# Patient Record
Sex: Male | Born: 2000 | Race: Black or African American | Hispanic: No | Marital: Single | Smoking: Never smoker
Health system: Southern US, Community
[De-identification: ages and names within clinical notes are randomized; demographics above are authoritative.]

## PROBLEM LIST (undated history)

## (undated) DIAGNOSIS — F4310 Post-traumatic stress disorder, unspecified: Secondary | ICD-10-CM

## (undated) DIAGNOSIS — F329 Major depressive disorder, single episode, unspecified: Secondary | ICD-10-CM

## (undated) DIAGNOSIS — F32A Depression, unspecified: Secondary | ICD-10-CM

## (undated) DIAGNOSIS — T7840XA Allergy, unspecified, initial encounter: Secondary | ICD-10-CM

## (undated) DIAGNOSIS — J45909 Unspecified asthma, uncomplicated: Secondary | ICD-10-CM

---
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
## 2019-06-25 ENCOUNTER — Encounter (HOSPITAL_COMMUNITY): Payer: Self-pay | Admitting: *Deleted

## 2019-06-25 ENCOUNTER — Emergency Department (HOSPITAL_COMMUNITY)
Admission: EM | Admit: 2019-06-25 | Discharge: 2019-06-25 | Disposition: A | Discharge status: Home / Self Care | Payer: Medicaid Other | Attending: Emergency Medicine | Admitting: Emergency Medicine

## 2019-06-25 ENCOUNTER — Other Ambulatory Visit: Payer: Self-pay

## 2019-06-25 ENCOUNTER — Emergency Department (HOSPITAL_COMMUNITY): Payer: Medicaid Other

## 2019-06-25 DIAGNOSIS — Z79899 Other long term (current) drug therapy: Secondary | ICD-10-CM | POA: Diagnosis not present

## 2019-06-25 DIAGNOSIS — Y92410 Unspecified street and highway as the place of occurrence of the external cause: Secondary | ICD-10-CM | POA: Insufficient documentation

## 2019-06-25 DIAGNOSIS — S50311A Abrasion of right elbow, initial encounter: Secondary | ICD-10-CM | POA: Diagnosis not present

## 2019-06-25 DIAGNOSIS — Y9351 Activity, roller skating (inline) and skateboarding: Secondary | ICD-10-CM | POA: Diagnosis not present

## 2019-06-25 DIAGNOSIS — Y999 Unspecified external cause status: Secondary | ICD-10-CM | POA: Diagnosis not present

## 2019-06-25 DIAGNOSIS — S81012A Laceration without foreign body, left knee, initial encounter: Secondary | ICD-10-CM | POA: Diagnosis not present

## 2019-06-25 DIAGNOSIS — Z23 Encounter for immunization: Secondary | ICD-10-CM | POA: Insufficient documentation

## 2019-06-25 DIAGNOSIS — J45909 Unspecified asthma, uncomplicated: Secondary | ICD-10-CM | POA: Diagnosis not present

## 2019-06-25 MED ORDER — LIDOCAINE-EPINEPHRINE (PF) 2 %-1:200000 IJ SOLN
10.0000 mL | Freq: Once | INTRAMUSCULAR | Status: AC
  Administered 2019-06-25: 10 mL
  Filled 2019-06-25: qty 20

## 2019-06-25 MED ORDER — TETANUS-DIPHTH-ACELL PERTUSSIS 5-2.5-18.5 LF-MCG/0.5 IM SUSP
0.5000 mL | Freq: Once | INTRAMUSCULAR | Status: AC
  Administered 2019-06-25: 0.5 mL via INTRAMUSCULAR
  Filled 2019-06-25: qty 0.5

## 2019-06-25 MED ORDER — DOXYCYCLINE HYCLATE 100 MG PO CAPS
100.0000 mg | ORAL_CAPSULE | Freq: Two times a day (BID) | ORAL | 0 refills | Status: AC
Start: 2019-06-25 — End: 2019-07-02

## 2019-06-25 MED ORDER — LIDOCAINE-EPINEPHRINE-TETRACAINE (LET) SOLUTION
3.0000 mL | Freq: Once | NASAL | Status: AC
  Administered 2019-06-25: 3 mL via TOPICAL
  Filled 2019-06-25: qty 3
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
## 2019-06-25 NOTE — ED Notes (Signed)
ED Provider at bedside. 

## 2019-06-25 NOTE — ED Triage Notes (Signed)
Pt reports injuring left knee while skating. Has large laceration noted, bleeding controlled.

## 2019-06-25 NOTE — Discharge Instructions (Signed)
You have been diagnosed today with  Left Knee Laceration and Right Elbow Abrasion.  At this time there does not appear to be the presence of an emergent medical condition, however there is always the potential for conditions to change. Please read and follow the below instructions.  Please return to the Emergency Department immediately for any new or worsening symptoms. Please be sure to follow up with your Primary Care Provider within one week regarding your visit today; please call their office to schedule an appointment even if you are feeling better for a follow-up visit. Your 8 stitches must be removed in 8 days, these can be removed at an urgent care, your primary care provider's office or if necessary here at the emergency department.  Please take the antibiotic doxycycline as prescribed to help prevent infection.  Get help right away if: You have very bad swelling around your wound. You have pus or a bad smell coming from your wound. Your pain suddenly gets worse and is very bad. You have painful lumps near your wound or anywhere on your body. You have a red streak going away from your wound. The wound is on your hand or foot, and: You cannot move a finger or toe as you used to do. Your fingers or toes look pale or blue. You have numbness that spreads down your hand, foot, fingers, or toes. You have a red streak going away from your wound. You have a fever. You have fluid, blood, or pus coming from your wound. There is a bad smell coming from your wound or bandage. Any new/concerning or worsening symptoms  Please read the additional information packets attached to your discharge summary.  Do not take your medicine if  develop an itchy rash, swelling in your mouth or lips, or difficulty breathing; call 911 and seek immediate emergency medical attention if this occurs.

## 2019-06-25 NOTE — ED Notes (Signed)
Patient verbalized understanding of dc instructions, vss, ambulatory with nad.   

## 2020-03-05 ENCOUNTER — Encounter (HOSPITAL_COMMUNITY): Payer: Self-pay

## 2020-03-05 ENCOUNTER — Other Ambulatory Visit: Payer: Self-pay

## 2020-03-05 ENCOUNTER — Emergency Department (HOSPITAL_COMMUNITY)
Admission: EM | Admit: 2020-03-05 | Discharge: 2020-03-06 | Disposition: A | Discharge status: Home / Self Care | Payer: Medicaid Other | Attending: Emergency Medicine | Admitting: Emergency Medicine

## 2020-03-05 DIAGNOSIS — Z5321 Procedure and treatment not carried out due to patient leaving prior to being seen by health care provider: Secondary | ICD-10-CM | POA: Diagnosis not present

## 2020-03-05 DIAGNOSIS — R0981 Nasal congestion: Secondary | ICD-10-CM | POA: Insufficient documentation

## 2020-03-05 DIAGNOSIS — H9203 Otalgia, bilateral: Secondary | ICD-10-CM | POA: Insufficient documentation

## 2020-03-05 DIAGNOSIS — J45909 Unspecified asthma, uncomplicated: Secondary | ICD-10-CM | POA: Diagnosis not present

## 2020-03-05 NOTE — ED Triage Notes (Signed)
Pt reports that he has been having trouble with his asthma and nasal congestion, and pain in bilateral ears since yesterday morning. Denies fevers.

## 2020-03-15 ENCOUNTER — Other Ambulatory Visit: Payer: Self-pay

## 2020-03-15 ENCOUNTER — Emergency Department (HOSPITAL_COMMUNITY): Payer: Medicaid Other

## 2020-03-15 ENCOUNTER — Emergency Department (HOSPITAL_COMMUNITY)
Admission: EM | Admit: 2020-03-15 | Discharge: 2020-03-15 | Disposition: A | Discharge status: Home / Self Care | Payer: Medicaid Other | Attending: Emergency Medicine | Admitting: Emergency Medicine

## 2020-03-15 DIAGNOSIS — R109 Unspecified abdominal pain: Secondary | ICD-10-CM | POA: Insufficient documentation

## 2020-03-15 DIAGNOSIS — Z5321 Procedure and treatment not carried out due to patient leaving prior to being seen by health care provider: Secondary | ICD-10-CM | POA: Insufficient documentation

## 2020-03-15 DIAGNOSIS — R112 Nausea with vomiting, unspecified: Secondary | ICD-10-CM | POA: Insufficient documentation

## 2020-03-15 LAB — COMPREHENSIVE METABOLIC PANEL
ALT: 22 U/L (ref 0–44)
AST: 22 U/L (ref 15–41)
Albumin: 4.7 g/dL (ref 3.5–5.0)
Alkaline Phosphatase: 51 U/L (ref 38–126)
Anion gap: 10 (ref 5–15)
BUN: 11 mg/dL (ref 6–20)
CO2: 26 mmol/L (ref 22–32)
Calcium: 9.8 mg/dL (ref 8.9–10.3)
Chloride: 100 mmol/L (ref 98–111)
Creatinine, Ser: 0.73 mg/dL (ref 0.61–1.24)
GFR calc Af Amer: >60 mL/min (ref >60)
GFR calc non Af Amer: >60 mL/min (ref >60)
Glucose, Bld: 126 mg/dL — ABNORMAL HIGH (ref 70–99)
Potassium: 3.8 mmol/L (ref 3.5–5.1)
Sodium: 136 mmol/L (ref 135–145)
Total Bilirubin: 0.8 mg/dL (ref 0.3–1.2)
Total Protein: 7.8 g/dL (ref 6.5–8.1)

## 2020-03-15 LAB — CBC
HCT: 43.6 % (ref 39.0–52.0)
Hemoglobin: 14.7 g/dL (ref 13.0–17.0)
MCH: 29.2 pg (ref 26.0–34.0)
MCHC: 33.7 g/dL (ref 30.0–36.0)
MCV: 86.7 fL (ref 80.0–100.0)
Platelets: 411 10*3/uL — ABNORMAL HIGH (ref 150–400)
RBC: 5.03 MIL/uL (ref 4.22–5.81)
RDW: 13.5 % (ref 11.5–15.5)
WBC: 10.5 10*3/uL (ref 4.0–10.5)
nRBC: 0 % (ref 0.0–0.2)

## 2020-03-15 LAB — TROPONIN I (HIGH SENSITIVITY): Troponin I (High Sensitivity): 4 ng/L (ref <18)

## 2020-03-15 LAB — LIPASE, BLOOD: Lipase: 26 U/L (ref 11–51)

## 2020-03-15 MED ORDER — ONDANSETRON 4 MG PO TBDP
4.0000 mg | ORAL_TABLET | Freq: Once | ORAL | Status: AC | PRN
  Administered 2020-03-15: 10:00:00 4 mg via ORAL
  Filled 2020-03-15: qty 1

## 2020-03-15 MED ORDER — SODIUM CHLORIDE 0.9% FLUSH: 3.0000 mL | Freq: Once | INTRAVENOUS | Status: DC

## 2020-03-15 NOTE — ED Notes (Signed)
Requested urine
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
## 2020-03-15 NOTE — ED Notes (Signed)
Patient requested staff call his mother so mother can take patient home. Patient stated "I want my mother to take me home because this is taking too long"  Patient's mother contacted. Mother stated she was en route to get patient.

## 2021-04-30 ENCOUNTER — Other Ambulatory Visit: Payer: Self-pay

## 2021-04-30 ENCOUNTER — Emergency Department (HOSPITAL_COMMUNITY)
Admission: EM | Admit: 2021-04-30 | Discharge: 2021-05-01 | Disposition: A | Discharge status: Home / Self Care | Payer: Medicaid Other | Attending: Emergency Medicine | Admitting: Emergency Medicine

## 2021-04-30 DIAGNOSIS — S61411A Laceration without foreign body of right hand, initial encounter: Secondary | ICD-10-CM | POA: Diagnosis not present

## 2021-04-30 DIAGNOSIS — Z7722 Contact with and (suspected) exposure to environmental tobacco smoke (acute) (chronic): Secondary | ICD-10-CM | POA: Insufficient documentation

## 2021-04-30 DIAGNOSIS — S6991XA Unspecified injury of right wrist, hand and finger(s), initial encounter: Secondary | ICD-10-CM | POA: Diagnosis present

## 2021-04-30 DIAGNOSIS — J45909 Unspecified asthma, uncomplicated: Secondary | ICD-10-CM | POA: Diagnosis not present

## 2021-04-30 DIAGNOSIS — W228XXA Striking against or struck by other objects, initial encounter: Secondary | ICD-10-CM | POA: Diagnosis not present

## 2021-05-01 ENCOUNTER — Encounter (HOSPITAL_COMMUNITY): Payer: Self-pay

## 2021-05-01 ENCOUNTER — Other Ambulatory Visit: Payer: Self-pay

## 2021-05-01 ENCOUNTER — Emergency Department (HOSPITAL_COMMUNITY): Payer: Medicaid Other
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
## 2021-05-01 NOTE — ED Notes (Signed)
Dermabond at bedside.  

## 2021-05-01 NOTE — ED Notes (Signed)
Patients right hand wrapped with gauze and cleaned before discharge.

## 2021-05-01 NOTE — Discharge Instructions (Addendum)
Thank you for allowing me to care for you today in the Emergency Department.   Keep the wound clean and dry for the next 24 hours.  Then, you can gently clean the area with warm water and soap.  The Dermabond glue will fall off on its own over the next week.  You can take Tylenol Motrin for pain control.  Your tetanus was updated in 2020 and is good for 10 years.  Return to the emergency department if you develop severe swelling, redness to the hand, wrist, fingers, if you start to have thick, mucus-like drainage from around the wound, or other new, concerning symptoms.
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
## 2022-02-27 ENCOUNTER — Emergency Department (HOSPITAL_COMMUNITY): Payer: Medicaid Other

## 2022-02-27 ENCOUNTER — Encounter (HOSPITAL_COMMUNITY): Payer: Self-pay

## 2022-02-27 ENCOUNTER — Other Ambulatory Visit: Payer: Self-pay

## 2022-02-27 ENCOUNTER — Emergency Department (HOSPITAL_COMMUNITY)
Admission: EM | Admit: 2022-02-27 | Discharge: 2022-02-27 | Disposition: A | Discharge status: Home / Self Care | Payer: Medicaid Other | Attending: Emergency Medicine | Admitting: Emergency Medicine

## 2022-02-27 DIAGNOSIS — Z20822 Contact with and (suspected) exposure to covid-19: Secondary | ICD-10-CM | POA: Insufficient documentation

## 2022-02-27 DIAGNOSIS — F10129 Alcohol abuse with intoxication, unspecified: Secondary | ICD-10-CM | POA: Insufficient documentation

## 2022-02-27 DIAGNOSIS — F10929 Alcohol use, unspecified with intoxication, unspecified: Secondary | ICD-10-CM

## 2022-02-27 DIAGNOSIS — R112 Nausea with vomiting, unspecified: Secondary | ICD-10-CM

## 2022-02-27 DIAGNOSIS — R519 Headache, unspecified: Secondary | ICD-10-CM | POA: Insufficient documentation

## 2022-02-27 DIAGNOSIS — R079 Chest pain, unspecified: Secondary | ICD-10-CM

## 2022-02-27 DIAGNOSIS — R252 Cramp and spasm: Secondary | ICD-10-CM | POA: Diagnosis not present

## 2022-02-27 DIAGNOSIS — R001 Bradycardia, unspecified: Secondary | ICD-10-CM | POA: Insufficient documentation

## 2022-02-27 DIAGNOSIS — Y909 Presence of alcohol in blood, level not specified: Secondary | ICD-10-CM | POA: Insufficient documentation

## 2022-02-27 LAB — CBC WITH DIFFERENTIAL/PLATELET
Abs Immature Granulocytes: 0.02 10*3/uL (ref 0.00–0.07)
Basophils Absolute: 0.1 10*3/uL (ref 0.0–0.1)
Basophils Relative: 1 %
Eosinophils Absolute: 0.2 10*3/uL (ref 0.0–0.5)
Eosinophils Relative: 3 %
HCT: 44.5 % (ref 39.0–52.0)
Hemoglobin: 15 g/dL (ref 13.0–17.0)
Immature Granulocytes: 0 %
Lymphocytes Relative: 27 %
Lymphs Abs: 1.7 10*3/uL (ref 0.7–4.0)
MCH: 28.7 pg (ref 26.0–34.0)
MCHC: 33.7 g/dL (ref 30.0–36.0)
MCV: 85.1 fL (ref 80.0–100.0)
Monocytes Absolute: 0.2 10*3/uL (ref 0.1–1.0)
Monocytes Relative: 3 %
Neutro Abs: 4.1 10*3/uL (ref 1.7–7.7)
Neutrophils Relative %: 66 %
Platelets: 243 10*3/uL (ref 150–400)
RBC: 5.23 MIL/uL (ref 4.22–5.81)
RDW: 13.7 % (ref 11.5–15.5)
WBC: 6.2 10*3/uL (ref 4.0–10.5)
nRBC: 0 % (ref 0.0–0.2)

## 2022-02-27 LAB — BASIC METABOLIC PANEL
Anion gap: 13 (ref 5–15)
BUN: 11 mg/dL (ref 6–20)
CO2: 22 mmol/L (ref 22–32)
Calcium: 9.9 mg/dL (ref 8.9–10.3)
Chloride: 105 mmol/L (ref 98–111)
Creatinine, Ser: 1.05 mg/dL (ref 0.61–1.24)
GFR, Estimated: >60 mL/min (ref >60)
Glucose, Bld: 140 mg/dL — ABNORMAL HIGH (ref 70–99)
Potassium: 4.3 mmol/L (ref 3.5–5.1)
Sodium: 140 mmol/L (ref 135–145)

## 2022-02-27 LAB — CBG MONITORING, ED: Glucose-Capillary: 132 mg/dL — ABNORMAL HIGH (ref 70–99)

## 2022-02-27 LAB — RESP PANEL BY RT-PCR (FLU A&B, COVID) ARPGX2
Influenza A by PCR: NEGATIVE
Influenza B by PCR: NEGATIVE
SARS Coronavirus 2 by RT PCR: NEGATIVE

## 2022-02-27 LAB — TROPONIN I (HIGH SENSITIVITY): Troponin I (High Sensitivity): 3 ng/L (ref <18)

## 2022-02-27 MED ORDER — PROCHLORPERAZINE MALEATE 10 MG PO TABS: 10.0000 mg | ORAL_TABLET | Freq: Two times a day (BID) | ORAL | 0 refills | Status: DC | PRN

## 2022-02-27 MED ORDER — SODIUM CHLORIDE 0.9 % IV BOLUS
1000.0000 mL | Freq: Once | INTRAVENOUS | Status: AC
  Administered 2022-02-27: 1000 mL via INTRAVENOUS

## 2022-02-27 MED ORDER — SODIUM CHLORIDE 0.9 % IV SOLN
12.5000 mg | Freq: Four times a day (QID) | INTRAVENOUS | Status: DC | PRN
  Administered 2022-02-27: 12.5 mg via INTRAVENOUS
  Filled 2022-02-27: qty 0.5

## 2022-02-27 MED ORDER — ONDANSETRON HCL 4 MG/2ML IJ SOLN
4.0000 mg | Freq: Once | INTRAMUSCULAR | Status: DC
  Filled 2022-02-27: qty 2

## 2022-02-27 NOTE — ED Provider Notes (Signed)
?[facility] ?Provider Note ? ? ?CSN: [id number] ?Arrival date & time:    ? ?  ? ?History ? ?Chief Complaint  ?Patient presents with  ? Alcohol Intoxication  ? ? ?Victor Stanley is a 21 y.o. male who presents the emergency department for alcohol intoxication.  Patient's girlfriend reports that he drink a significant mount of liquor last night.  EMS reported that during transport patient had an episode of unresponsiveness where his heart rate dropped down to 46.  Patient has been complaining that he has felt bad for the past week or so, and now states he is having headache, chest pain, and full body cramps.  Patient was given both aspirin and nitro while in route, and patient states that his chest pain improved.  He was placed on 2 L nasal cannula by EMS, unsure if this is due to hypoxia or for comfort. ? ? ?Alcohol Intoxication ?Associated symptoms include chest pain and headaches.  ? ?  ? ?Home Medications ?Prior to Admission medications   ?Medication Sig Start Date End Date Taking? Authorizing Provider  ?prochlorperazine (COMPAZINE) 10 MG tablet Take 1 tablet (10 mg total) by mouth 2 (two) times daily as needed for nausea or vomiting. 02/27/22  Yes Zelia Yzaguirre T, PA-C  ?   ? ?Allergies    ?Patient has no known allergies.   ? ?Review of Systems   ?Review of Systems  ?Constitutional:  Positive for chills.  ?Cardiovascular:  Positive for chest pain.  ?Gastrointestinal:  Positive for nausea and vomiting.  ?Musculoskeletal:  Positive for myalgias.  ?Neurological:  Positive for headaches.  ?All other systems reviewed and are negative. ? ?Physical Exam ?Updated Vital Signs ?BP 139/78   Pulse (!) 53   Temp 98.4 ?F (36.9 ?C) (Oral)   Resp 18   Ht 5\' 10"  (1.778 m)   Wt 74.8 kg   SpO2 100%   BMI 23.68 kg/m?  ?Physical Exam ?Vitals and nursing note reviewed.  ?Constitutional:   ?   Appearance: Normal appearance.  ?HENT:  ?   Head: Normocephalic and atraumatic.  ?Eyes:  ?    Conjunctiva/sclera: Conjunctivae normal.  ?Cardiovascular:  ?   Rate and Rhythm: Normal rate and regular rhythm.  ?Pulmonary:  ?   Effort: Pulmonary effort is normal. No respiratory distress.  ?   Breath sounds: Normal breath sounds.  ?Abdominal:  ?   General: There is no distension.  ?   Palpations: Abdomen is soft.  ?   Tenderness: There is no abdominal tenderness.  ?Skin: ?   General: Skin is warm and dry.  ?Neurological:  ?   General: No focal deficit present.  ?   Mental Status: He is alert.  ? ? ?ED Results / Procedures / Treatments   ?Labs ?(all labs ordered are listed, but only abnormal results are displayed) ?Labs Reviewed  ?BASIC METABOLIC PANEL - Abnormal; Notable for the following components:  ?    Result Value  ? Glucose, Bld 140 (*)   ? All other components within normal limits  ?CBG MONITORING, ED - Abnormal; Notable for the following components:  ? Glucose-Capillary 132 (*)   ? All other components within normal limits  ?RESP PANEL BY RT-PCR (FLU A&B, COVID) [id number]  ?CBC WITH DIFFERENTIAL/PLATELET  ?TROPONIN I (HIGH SENSITIVITY)  ? ? ?EKG ?EKG Interpretation ? ?Date/Time:  Saturday February 27 2022 09:18:21 EDT ?Ventricular Rate:  50 ?PR Interval:  158 ?QRS Duration: 84 ?QT Interval:  618 ?QTC Calculation:  564 ?R Axis:   76 ?Text Interpretation: Sinus rhythm Probable left atrial enlargement RSR' in V1 or V2, probably normal variant Left ventricular hypertrophy ST elev, probable normal early repol pattern Prolonged QT interval No significant change since last tracing Confirmed by Alvira Monday ([id number]) on 02/27/2022 9:54:37 AM ? ?Radiology ?DG Chest 2 View ? ?Result Date: 02/27/2022 ?CLINICAL DATA:  Shortness of breath EXAM: CHEST - 2 VIEW COMPARISON:  03/15/2020 FINDINGS: The heart size and mediastinal contours are within normal limits. Both lungs are clear. The visualized skeletal structures are unremarkable. IMPRESSION: No acute abnormality of the lungs. Electronically Signed   By: Jearld Lesch M.D.    On: 02/27/2022 10:08   ? ?Procedures ?Procedures  ? ? ?Medications Ordered in ED ?Medications  ?promethazine (PHENERGAN) 12.5 mg in sodium chloride 0.9 % 50 mL IVPB (0 mg Intravenous Stopped 02/27/22 1106)  ?sodium chloride 0.9 % bolus 1,000 mL (0 mLs Intravenous Stopped 02/27/22 1116)  ? ? ?ED Course/ Medical Decision Making/ A&P ?  ?                        ?Medical Decision Making ?Amount and/or Complexity of Data Reviewed ?Labs: ordered. ? ?This patient presents to the ED for concern of chest pain and alcohol intoxication, this involves an extensive number of treatment options, and is a complaint that carries with it a high risk of complications and morbidity. The emergent differential diagnosis prior to evaluation includes, but is not limited to,  ACS, pericarditis, aortic dissection, PE, pneumothorax, esophageal spasm or rupture, chronic angina, valvular disease, cardiomyopathy, myocarditis, pulmonary HTN, pneumonia, bronchitis, GERD, reflux/PUD, biliary disease, pancreatitis, disk disease or arthritis, costochondritis, anxiety or panic attack. ? ?This is not an exhaustive differential.  ? ?Past Medical History / Co-morbidities / Social History: ?Asthma, depression ? ?Physical Exam: ?Physical exam performed. The pertinent findings include: Patient is afebrile, bradycardic to 52. In no acute distress. On my reevaluation, pulse improved to 72. I took patient off of supplemental oxygen and he mantained normal oxygen saturations.  ? ?Lab Tests: ?I ordered, and personally interpreted labs.  The pertinent results include: No leukocytosis, normal hemoglobin.  Electrolytes within normal limits.  Initial troponin of 3. Negative COVID and flu.  ?  ?Imaging Studies: ?I ordered imaging studies including chest x-ray. I independently visualized and interpreted imaging which showed no acute cardiopulmonary abnormalities. I agree with the radiologist interpretation. ?  ?Cardiac Monitoring:  ?The patient was maintained on a cardiac  monitor.  My attending physician Dr. Dalene Seltzer viewed and interpreted the cardiac monitored which showed an underlying rhythm of: Sinus rhythm.  Mild ST elevation, does not meet STEMI criteria.  I agree with this interpretation. ?  ?Medications: ?I ordered medication including IV fluids and antiemetics  for vomiting. Reevaluation of the patient after these medicines showed that the patient improved. I have reviewed the patients home medicines and have made adjustments as needed. ? ?Disposition: ?After consideration of the diagnostic results and the patients response to treatment, I feel that patient is not requiring admission or inpatient treatment for symptoms.  I think his chest pain was likely related to numerous episodes of vomiting.  He denied any blood in his vomit, has a normal hemoglobin, and maintained normal hemodynamics while in the emergency department, so very low suspicion for other pathologies such as esophageal tears due to retching.  We will discharge home with antiemetics and encourage good fluid intake.  We discussed reasons return to the  emergency department, and patient is agreeable to the plan. ? ?I discussed this case with my attending physician Dr. Dalene Seltzer who cosigned this note including patient's presenting symptoms, physical exam, and planned diagnostics and interventions. Attending physician stated agreement with plan or made changes to plan which were implemented.    ? ?Final Clinical Impression(s) / ED Diagnoses ?Final diagnoses:  ?Nausea and vomiting, unspecified vomiting type  ?Chest pain, unspecified type  ?Alcoholic intoxication with complication (HCC)  ? ? ?Rx / DC Orders ?ED Discharge Orders   ? ?      Ordered  ?  prochlorperazine (COMPAZINE) 10 MG tablet  2 times daily PRN       ? 02/27/22 1152  ? ?  ?  ? ?  ? ?Portions of this report may have been transcribed using voice recognition software. Every effort was made to ensure accuracy; however, inadvertent computerized  transcription errors may be present. ? ?  ?Su Monks, PA-C ?02/27/22 1234 ? ?  ?Alvira Monday, MD ?02/28/22 0725 ? ?

## 2022-02-27 NOTE — Discharge Instructions (Signed)
You were seen in the emergency department today for vomiting and chest pain. ? ?As we discussed your lab work and imaging is all looked reassuring today.  The chest pain that you are having is likely related to the vomiting you are experiencing.  I have written you prescription for nausea medicine that you can take by mouth twice daily as needed for nausea or vomiting. ? ?Continue to monitor how you're doing and return to the ER for new or worsening symptoms.  ?

## 2022-02-27 NOTE — ED Triage Notes (Signed)
[facility] reports pt coming from home c/o drinking too much and nausea and vomiting. GF reports he drank 1/2 a Nutritional therapist. Upon arrival to ED EMS reports pt had a  episode of unresponsiveness, heart rate dropping to 46. Pt states he has been feeling bad for the past week or so with a headache. Pt now also c/o chest pain and full body cramps. ?

## 2022-02-28 ENCOUNTER — Emergency Department (HOSPITAL_COMMUNITY)
Admission: EM | Admit: 2022-02-28 | Discharge: 2022-02-28 | Disposition: A | Discharge status: Home / Self Care | Payer: Medicaid Other | Attending: Emergency Medicine | Admitting: Emergency Medicine

## 2022-02-28 ENCOUNTER — Encounter (HOSPITAL_COMMUNITY): Payer: Self-pay | Admitting: Emergency Medicine

## 2022-02-28 DIAGNOSIS — E876 Hypokalemia: Secondary | ICD-10-CM | POA: Diagnosis not present

## 2022-02-28 DIAGNOSIS — R112 Nausea with vomiting, unspecified: Secondary | ICD-10-CM | POA: Insufficient documentation

## 2022-02-28 DIAGNOSIS — J45909 Unspecified asthma, uncomplicated: Secondary | ICD-10-CM | POA: Insufficient documentation

## 2022-02-28 LAB — CBC WITH DIFFERENTIAL/PLATELET
Abs Immature Granulocytes: 0.03 10*3/uL (ref 0.00–0.07)
Basophils Absolute: 0.1 10*3/uL (ref 0.0–0.1)
Basophils Relative: 1 %
Eosinophils Absolute: 0 10*3/uL (ref 0.0–0.5)
Eosinophils Relative: 0 %
HCT: 44 % (ref 39.0–52.0)
Hemoglobin: 14.6 g/dL (ref 13.0–17.0)
Immature Granulocytes: 1 %
Lymphocytes Relative: 30 %
Lymphs Abs: 1.9 10*3/uL (ref 0.7–4.0)
MCH: 28.2 pg (ref 26.0–34.0)
MCHC: 33.2 g/dL (ref 30.0–36.0)
MCV: 84.9 fL (ref 80.0–100.0)
Monocytes Absolute: 0.8 10*3/uL (ref 0.1–1.0)
Monocytes Relative: 12 %
Neutro Abs: 3.6 10*3/uL (ref 1.7–7.7)
Neutrophils Relative %: 56 %
Platelets: 267 10*3/uL (ref 150–400)
RBC: 5.18 MIL/uL (ref 4.22–5.81)
RDW: 13.8 % (ref 11.5–15.5)
WBC: 6.3 10*3/uL (ref 4.0–10.5)
nRBC: 0 % (ref 0.0–0.2)

## 2022-02-28 LAB — COMPREHENSIVE METABOLIC PANEL
ALT: 25 U/L (ref 0–44)
AST: 27 U/L (ref 15–41)
Albumin: 4.7 g/dL (ref 3.5–5.0)
Alkaline Phosphatase: 62 U/L (ref 38–126)
Anion gap: 11 (ref 5–15)
BUN: 13 mg/dL (ref 6–20)
CO2: 27 mmol/L (ref 22–32)
Calcium: 9.9 mg/dL (ref 8.9–10.3)
Chloride: 103 mmol/L (ref 98–111)
Creatinine, Ser: 1.1 mg/dL (ref 0.61–1.24)
GFR, Estimated: >60 mL/min (ref >60)
Glucose, Bld: 130 mg/dL — ABNORMAL HIGH (ref 70–99)
Potassium: 3.4 mmol/L — ABNORMAL LOW (ref 3.5–5.1)
Sodium: 141 mmol/L (ref 135–145)
Total Bilirubin: 0.6 mg/dL (ref 0.3–1.2)
Total Protein: 8.5 g/dL — ABNORMAL HIGH (ref 6.5–8.1)

## 2022-02-28 LAB — LIPASE, BLOOD: Lipase: 54 U/L — ABNORMAL HIGH (ref 11–51)

## 2022-02-28 LAB — CK: Total CK: 85 U/L (ref 49–397)

## 2022-02-28 LAB — MAGNESIUM: Magnesium: 2.3 mg/dL (ref 1.7–2.4)

## 2022-02-28 LAB — ETHANOL: Alcohol, Ethyl (B): <10 mg/dL (ref <10)

## 2022-02-28 MED ORDER — ONDANSETRON HCL 4 MG/2ML IJ SOLN
4.0000 mg | Freq: Once | INTRAMUSCULAR | Status: AC
  Administered 2022-02-28: 4 mg via INTRAVENOUS
  Filled 2022-02-28: qty 2

## 2022-02-28 MED ORDER — SODIUM CHLORIDE 0.9 % IV BOLUS
1000.0000 mL | Freq: Once | INTRAVENOUS | Status: AC
  Administered 2022-02-28: 1000 mL via INTRAVENOUS

## 2022-02-28 MED ORDER — LORAZEPAM 2 MG/ML IJ SOLN
1.0000 mg | Freq: Once | INTRAMUSCULAR | Status: AC
  Administered 2022-02-28: 1 mg via INTRAVENOUS
  Filled 2022-02-28: qty 1

## 2022-02-28 NOTE — ED Provider Notes (Signed)
?[facility] DEPT ?Provider Note ? ? ?CSN: [id number] ?Arrival date & time: 02/28/22  0340 ? ?  ?History ? ?Emesis ? ? ?Meshilem Sudhoff is a 21 y.o. male history of asthma, depression, PTSD here for evaluation of  emesis. Drank 1/2 gallon of Crown on Friday. He still does not feel good, multiple episodes of NBNB emesis.  Had some intermittent abdominal cramping which resolves with emesis. Does state he feels like he is "cramping all over.  No recent illnesses.  Does occasionally use marijuana however denies any use over the last few days.  States he has been unable to keep down even liquids or medications given to him to his prior ED visit.  No headache, chest pain, shortness of breath, back pain, numbness, weakness, swelling to extremities.  No recent traumatic injuries ? ?Apparently was anxious with EMS. ? ?Seen yesterday for EtOh intoxication, CP, HA and myalgias. ? ?HPI ? ?  ? ?Home Medications ?Prior to Admission medications   ?Medication Sig Start Date End Date Taking? Authorizing Provider  ?prochlorperazine (COMPAZINE) 10 MG tablet Take 1 tablet (10 mg total) by mouth 2 (two) times daily as needed for nausea or vomiting. 02/27/22   Roemhildt, Lorin T, PA-C  ?   ? ?Allergies    ?Patient has no known allergies.   ? ?Review of Systems   ?Review of Systems  ?Constitutional:  Positive for activity change, appetite change and fatigue.  ?HENT: Negative.    ?Respiratory: Negative.    ?Cardiovascular: Negative.   ?Gastrointestinal:  Positive for nausea and vomiting. Negative for abdominal distention, abdominal pain, anal bleeding, blood in stool, constipation, diarrhea and rectal pain.  ?Genitourinary: Negative.   ?Musculoskeletal:  Positive for myalgias.  ?Skin: Negative.   ?Neurological:  Positive for weakness (generalized). Negative for dizziness, tremors, seizures, syncope, facial asymmetry, speech difficulty, light-headedness, numbness and headaches.  ?All other systems reviewed and are  negative. ? ?Physical Exam ?Updated Vital Signs ?BP (!) 143/80   Pulse (!) 48   Temp 98.9 ?F (37.2 ?C) (Oral)   Resp 17   SpO2 100%  ?Physical Exam ?Vitals and nursing note reviewed.  ?Constitutional:   ?   General: He is not in acute distress. ?   Appearance: He is well-developed. He is not ill-appearing, toxic-appearing or diaphoretic.  ?   Comments: Actively vomiting  in room  ?HENT:  ?   Head: Normocephalic and atraumatic.  ?   Nose: Nose normal.  ?   Mouth/Throat:  ?   Mouth: Mucous membranes are dry.  ?Eyes:  ?   Pupils: Pupils are equal, round, and reactive to light.  ?Cardiovascular:  ?   Rate and Rhythm: Normal rate and regular rhythm.  ?   Pulses: Normal pulses.  ?   Heart sounds: Normal heart sounds.  ?Pulmonary:  ?   Effort: Pulmonary effort is normal. No respiratory distress.  ?   Breath sounds: Normal breath sounds.  ?   Comments: Clear bilaterally, speaks in full sentences without difficulty ?Abdominal:  ?   General: Bowel sounds are normal. There is no distension.  ?   Palpations: Abdomen is soft.  ?   Tenderness: There is no abdominal tenderness. There is no right CVA tenderness, left CVA tenderness, guarding or rebound.  ?   Comments: Soft, nontender  ?Musculoskeletal:     ?   General: No swelling, tenderness, deformity or signs of injury. Normal range of motion.  ?   Cervical back: Normal range of motion  and neck supple.  ?   Right lower leg: No edema.  ?   Left lower leg: No edema.  ?   Comments: Full range of motion, compartment soft.  ?Skin: ?   General: Skin is warm and dry.  ?   Capillary Refill: Capillary refill takes less than 2 seconds.  ?Neurological:  ?   General: No focal deficit present.  ?   Mental Status: He is alert and oriented to person, place, and time.  ?   Comments: Follows commands ?Ambulatory  ? ? ?ED Results / Procedures / Treatments   ?Labs ?(all labs ordered are listed, but only abnormal results are displayed) ?Labs Reviewed  ?COMPREHENSIVE METABOLIC PANEL - Abnormal;  Notable for the following components:  ?    Result Value  ? Potassium 3.4 (*)   ? Glucose, Bld 130 (*)   ? Total Protein 8.5 (*)   ? All other components within normal limits  ?LIPASE, BLOOD - Abnormal; Notable for the following components:  ? Lipase 54 (*)   ? All other components within normal limits  ?CBC WITH DIFFERENTIAL/PLATELET  ?ETHANOL  ?CK  ?MAGNESIUM  ?RAPID URINE DRUG SCREEN, HOSP PERFORMED  ? ? ?EKG ?None ? ?Radiology ?DG Chest 2 View ? ?Result Date: 02/27/2022 ?CLINICAL DATA:  Shortness of breath EXAM: CHEST - 2 VIEW COMPARISON:  03/15/2020 FINDINGS: The heart size and mediastinal contours are within normal limits. Both lungs are clear. The visualized skeletal structures are unremarkable. IMPRESSION: No acute abnormality of the lungs. Electronically Signed   By: Delanna Ahmadi M.D.   On: 02/27/2022 10:08   ? ?Procedures ?Procedures  ? ? ?Medications Ordered in ED ?Medications  ?LORazepam (ATIVAN) injection 1 mg (1 mg Intravenous Given 02/28/22 0417)  ?sodium chloride 0.9 % bolus 1,000 mL (1,000 mLs Intravenous New Bag/Given 02/28/22 0416)  ? ? ?ED Course/ Medical Decision Making/ A&P ?  ? ?21 year old here for evaluation nausea vomiting and generalized myalgias after drinking 1/5 of liquor 2 days ago.  Initially was seen in ED, cleared, discharged home.  Subsequently having multiple episodes of NBNB emesis at home.  No recent traumatic injuries.  Denies any recent marijuana use.  Not having any bloody emesis, no history of esophageal varices.  No focal abdominal pain. He is He is Afebrile, nonseptic, not ill-appearing however is actively vomiting in the room. EKG from prior visit show QTC 564. Will hold off on prolonging qt medications until repeat EKG obtained. ? ?Labs personally viewed and interpreted: ? ?CBC no leukocytosis ?CMP mild hypokalemia 3.4 ?Lipase 54 ?Mag 2.3 ?CK  85 ?Ethanol <10 ?UDS pending ?EKG QTC 428 ? ?Patient reassessed.  Feels improved however sleepy after Ativan.  No further emesis since  given. ? ?Care transferred to oncoming PA who will follow up on reaming labs,  reassessment and determine disposition. ? ? ?                        ?Medical Decision Making ?Amount and/or Complexity of Data Reviewed ?Independent Historian: EMS ?External Data Reviewed: labs, radiology, ECG and notes. ?Labs: ordered. Decision-making details documented in ED Course. ?Radiology: ordered and independent interpretation performed. Decision-making details documented in ED Course. ?ECG/medicine tests: ordered and independent interpretation performed. Decision-making details documented in ED Course. ? ?Risk ?OTC drugs. ?Prescription drug management. ?Parenteral controlled substances. ?Decision regarding hospitalization. ?Diagnosis or treatment significantly limited by social determinants of health. ? ? ? ? ? ? ? ? ?Final Clinical Impression(s) /  ED Diagnoses ?Final diagnoses:  ?Nausea and vomiting, unspecified vomiting type  ?Hypokalemia  ? ? ?Rx / DC Orders ?ED Discharge Orders   ? ? None  ? ?  ? ? ?  ?Nagi Furio A, PA-C ?02/28/22 [id number] ? ?  ?Maudie Flakes, MD ?02/28/22 [phone] ? ?

## 2022-02-28 NOTE — Discharge Instructions (Addendum)
You were seen in the emergency department today for vomiting. ? ?Your work-up was reassuring.  Make sure to pick up your prescription when the pharmacy opens today.  We gave you one last dose of the antinausea medication, and this should hold you over until then.  Try to encourage good fluid intake at home, eat some bland food to let your stomach rest. ?

## 2022-02-28 NOTE — ED Triage Notes (Signed)
Pt BIB [facility] from home. He reports that he drank 1/2 gallon of Crown on Friday and still doesn't feel good. He was seen at [facility] yesterday and given IV fluids. EMS also reports anxiety. Reports a hx of binge drinking.  ?

## 2022-02-28 NOTE — ED Provider Notes (Signed)
?  Physical Exam  ?BP (!) 143/80   Pulse (!) 48   Temp 98.9 ?F (37.2 ?C) (Oral)   Resp 17   SpO2 100%  ? ?Physical Exam ?Vitals and nursing note reviewed.  ?Constitutional:   ?   Appearance: Normal appearance.  ?HENT:  ?   Head: Normocephalic and atraumatic.  ?Eyes:  ?   Conjunctiva/sclera: Conjunctivae normal.  ?Pulmonary:  ?   Effort: Pulmonary effort is normal. No respiratory distress.  ?Abdominal:  ?   General: Abdomen is flat.  ?   Palpations: Abdomen is soft.  ?   Tenderness: There is no abdominal tenderness. There is no guarding.  ?Skin: ?   General: Skin is warm and dry.  ?Neurological:  ?   Mental Status: He is alert.  ?Psychiatric:     ?   Mood and Affect: Mood normal.     ?   Behavior: Behavior normal.  ? ? ?Procedures  ?Procedures ? ?ED Course / MDM  ?  ?Medical Decision Making ?Amount and/or Complexity of Data Reviewed ?Labs: ordered. ? ?Risk ?Prescription drug management. ? ? ?Accepted handoff at shift change from [facility]. Please see prior provider note for full HPI. Patient was seen previously by myself yesterday at [facility] for similar symptoms and had a normal workup. He was discharged in stable condition with prescription for antiemetics. He called EMS again as he was still not feeling well.  ? ?Briefly: Patient is 21 year old male who presents to the ER for vomiting and anxiety after binge drinking. Normal lab evaluation. ? ?DDX/Plan: Continue IV fluids and PO challenge.  ? ?On my reevaluation patient was able to tolerate p.o. after a second dose of antiemetics.  Patient's significant other states that they were unable to pick up his prescription yesterday that I had ordered.  They plan to pick it up today when the pharmacy opens later this morning.  Discussed the results with the patient and significant other, and I feel he is not requiring admission or inpatient treatment for his symptoms.  We discussed reasons return to the emergency department, patient is agreeable to the plan. ?   Estill Cotta ?02/28/22 [id number] ? ?  ?Blanchie Dessert, MD ?03/03/22 [phone] ? ?

## 2022-03-01 ENCOUNTER — Emergency Department (HOSPITAL_COMMUNITY): Payer: Medicaid Other

## 2022-03-01 ENCOUNTER — Encounter (HOSPITAL_COMMUNITY): Payer: Self-pay

## 2022-03-01 ENCOUNTER — Other Ambulatory Visit: Payer: Self-pay

## 2022-03-01 ENCOUNTER — Emergency Department (HOSPITAL_COMMUNITY)
Admission: EM | Admit: 2022-03-01 | Discharge: 2022-03-01 | Disposition: A | Discharge status: Home / Self Care | Payer: Medicaid Other | Attending: Emergency Medicine | Admitting: Emergency Medicine

## 2022-03-01 DIAGNOSIS — R1013 Epigastric pain: Secondary | ICD-10-CM | POA: Diagnosis present

## 2022-03-01 DIAGNOSIS — R112 Nausea with vomiting, unspecified: Secondary | ICD-10-CM | POA: Diagnosis not present

## 2022-03-01 DIAGNOSIS — R748 Abnormal levels of other serum enzymes: Secondary | ICD-10-CM | POA: Diagnosis not present

## 2022-03-01 LAB — CBC WITH DIFFERENTIAL/PLATELET
Abs Immature Granulocytes: 0 10*3/uL (ref 0.00–0.07)
Basophils Absolute: 0.2 10*3/uL — ABNORMAL HIGH (ref 0.0–0.1)
Basophils Relative: 3 %
Eosinophils Absolute: 0 10*3/uL (ref 0.0–0.5)
Eosinophils Relative: 0 %
HCT: 40.4 % (ref 39.0–52.0)
Hemoglobin: 13.3 g/dL (ref 13.0–17.0)
Lymphocytes Relative: 32 %
Lymphs Abs: 1.9 10*3/uL (ref 0.7–4.0)
MCH: 28.5 pg (ref 26.0–34.0)
MCHC: 32.9 g/dL (ref 30.0–36.0)
MCV: 86.7 fL (ref 80.0–100.0)
Monocytes Absolute: 0.3 10*3/uL (ref 0.1–1.0)
Monocytes Relative: 6 %
Neutro Abs: 3.4 10*3/uL (ref 1.7–7.7)
Neutrophils Relative %: 59 %
Platelets: 236 10*3/uL (ref 150–400)
RBC: 4.66 MIL/uL (ref 4.22–5.81)
RDW: 13.9 % (ref 11.5–15.5)
WBC: 5.8 10*3/uL (ref 4.0–10.5)
nRBC: 0 % (ref 0.0–0.2)
nRBC: 0 /100 WBC

## 2022-03-01 LAB — LIPASE, BLOOD: Lipase: 34 U/L (ref 11–51)

## 2022-03-01 LAB — COMPREHENSIVE METABOLIC PANEL
ALT: 22 U/L (ref 0–44)
AST: 22 U/L (ref 15–41)
Albumin: 4 g/dL (ref 3.5–5.0)
Alkaline Phosphatase: 50 U/L (ref 38–126)
Anion gap: 8 (ref 5–15)
BUN: 9 mg/dL (ref 6–20)
CO2: 25 mmol/L (ref 22–32)
Calcium: 8.9 mg/dL (ref 8.9–10.3)
Chloride: 106 mmol/L (ref 98–111)
Creatinine, Ser: 0.99 mg/dL (ref 0.61–1.24)
GFR, Estimated: >60 mL/min (ref >60)
Glucose, Bld: 122 mg/dL — ABNORMAL HIGH (ref 70–99)
Potassium: 3.6 mmol/L (ref 3.5–5.1)
Sodium: 139 mmol/L (ref 135–145)
Total Bilirubin: 0.4 mg/dL (ref 0.3–1.2)
Total Protein: 7 g/dL (ref 6.5–8.1)

## 2022-03-01 MED ORDER — ALUM & MAG HYDROXIDE-SIMETH 200-200-20 MG/5ML PO SUSP
30.0000 mL | Freq: Once | ORAL | Status: AC
Start: 2022-03-01 — End: 2022-03-01
  Administered 2022-03-01: 30 mL via ORAL
  Filled 2022-03-01: qty 30

## 2022-03-01 MED ORDER — IOHEXOL 350 MG/ML SOLN
100.0000 mL | Freq: Once | INTRAVENOUS | Status: AC | PRN
  Administered 2022-03-01: 100 mL via INTRAVENOUS

## 2022-03-01 MED ORDER — PANTOPRAZOLE SODIUM 40 MG IV SOLR
40.0000 mg | Freq: Once | INTRAVENOUS | Status: AC
  Administered 2022-03-01: 40 mg via INTRAVENOUS
  Filled 2022-03-01: qty 10

## 2022-03-01 MED ORDER — PANTOPRAZOLE SODIUM 20 MG PO TBEC: 20.0000 mg | DELAYED_RELEASE_TABLET | Freq: Two times a day (BID) | ORAL | 0 refills | Status: DC

## 2022-03-01 MED ORDER — LACTATED RINGERS IV BOLUS
1000.0000 mL | Freq: Once | INTRAVENOUS | Status: AC
  Administered 2022-03-01: 1000 mL via INTRAVENOUS

## 2022-03-01 MED ORDER — LIDOCAINE VISCOUS HCL 2 % MT SOLN
15.0000 mL | Freq: Once | OROMUCOSAL | Status: AC
Start: 2022-03-01 — End: 2022-03-01
  Administered 2022-03-01: 15 mL via ORAL
  Filled 2022-03-01: qty 15

## 2022-03-01 MED ORDER — ONDANSETRON HCL 4 MG/2ML IJ SOLN
4.0000 mg | Freq: Once | INTRAMUSCULAR | Status: AC
  Administered 2022-03-01: 4 mg via INTRAVENOUS
  Filled 2022-03-01: qty 2

## 2022-03-01 MED ORDER — SODIUM CHLORIDE 0.9 % IV SOLN
25.0000 mg | Freq: Once | INTRAVENOUS | Status: AC
  Administered 2022-03-01: 25 mg via INTRAVENOUS
  Filled 2022-03-01: qty 1

## 2022-03-01 NOTE — ED Triage Notes (Signed)
Epigastric abd pain + n&v 4 days. Recently seen for same. ?

## 2022-03-01 NOTE — ED Notes (Signed)
Assumed care of patient.

## 2022-03-01 NOTE — ED Notes (Signed)
Pt in CT.

## 2022-03-01 NOTE — ED Notes (Signed)
Pt requested something to drink. Ice and drink given ? ?

## 2022-03-01 NOTE — ED Provider Notes (Signed)
?[facility] EMERGENCY DEPARTMENT ?Provider Note ? ? ?CSN: [id number] ?Arrival date & time: 03/01/22  0454 ? ?  ? ?History ? ?Chief Complaint  ?Patient presents with  ? Abdominal Pain  ?  Epigastric abd pain + n&v 4 days. Recently seen for same.  ? ? ?Victor Stanley is a 21 y.o. male. ? ?The history is provided by the patient.  ?Abdominal Pain ?He comes in because of ongoing nausea, vomiting, epigastric pain.  He was initially seen in emergency department 2 days ago and was given an antiemetic and some IV fluids in the ED and went home feeling somewhat better.  However, nausea and vomiting recurred after going home and he went to another ED yesterday and received additional medication and IVs but did not get any better.  He did get a prescription for prochlorperazine filled and did take a dose today, but it did not help.  He had initially presented following ethanol ingestion, but states he has not had any ethanol since then.  He denies fever, chills, sweats.  He denies any constipation or diarrhea. ?  ?Home Medications ?Prior to Admission medications   ?Medication Sig Start Date End Date Taking? Authorizing Provider  ?prochlorperazine (COMPAZINE) 10 MG tablet Take 1 tablet (10 mg total) by mouth 2 (two) times daily as needed for nausea or vomiting. 02/27/22   Roemhildt, Lorin T, PA-C  ?   ? ?Allergies    ?Patient has no known allergies.   ? ?Review of Systems   ?Review of Systems  ?Gastrointestinal:  Positive for abdominal pain.  ?All other systems reviewed and are negative. ? ?Physical Exam ?Updated Vital Signs ?BP (!) 145/85   Pulse (!) 48   Temp 98.8 ?F (37.1 ?C) (Oral)   Resp 16   Ht 5\' 10"  (1.778 m)   Wt 74.8 kg   SpO2 99%   BMI 23.68 kg/m?  ?Physical Exam ?Vitals and nursing note reviewed.  ?21 year old male, resting comfortably and in no acute distress. Vital signs are significant for slow heart rate and borderline elevated blood pressure. Oxygen saturation is 99%, which is  normal. ?Head is normocephalic and atraumatic. PERRLA, EOMI. Oropharynx is clear. ?Neck is nontender and supple without adenopathy or JVD. ?Back is nontender and there is no CVA tenderness. ?Lungs are clear without rales, wheezes, or rhonchi. ?Chest is nontender. ?Heart has regular rate and rhythm without murmur. ?Abdomen is soft, flat, with moderate epigastric tenderness.  There is no rebound or guarding.  There are no masses or hepatosplenomegaly and peristalsis is hypoactive. ?Extremities have no cyanosis or edema, full range of motion is present. ?Skin is warm and dry without rash. ?Neurologic: Mental status is normal, cranial nerves are intact, moves all extremities equally. ? ?ED Results / Procedures / Treatments   ?Labs ?(all labs ordered are listed, but only abnormal results are displayed) ?Labs Reviewed  ?COMPREHENSIVE METABOLIC PANEL - Abnormal; Notable for the following components:  ?    Result Value  ? Glucose, Bld 122 (*)   ? All other components within normal limits  ?CBC WITH DIFFERENTIAL/PLATELET - Abnormal; Notable for the following components:  ? Basophils Absolute 0.2 (*)   ? All other components within normal limits  ?LIPASE, BLOOD  ?URINALYSIS, ROUTINE W REFLEX MICROSCOPIC  ? ? ?EKG ?None ? ?Radiology ?DG Chest 2 View ? ?Result Date: 02/27/2022 ?CLINICAL DATA:  Shortness of breath EXAM: CHEST - 2 VIEW COMPARISON:  03/15/2020 FINDINGS: The heart size and mediastinal contours are within  normal limits. Both lungs are clear. The visualized skeletal structures are unremarkable. IMPRESSION: No acute abnormality of the lungs. Electronically Signed   By: Jearld Lesch M.D.   On: 02/27/2022 10:08   ? ?Procedures ?Procedures  ? ? ?Medications Ordered in ED ?Medications  ?lactated ringers bolus 1,000 mL (has no administration in time range)  ?promethazine (PHENERGAN) 25 mg in sodium chloride 0.9 % 50 mL IVPB (has no administration in time range)  ?pantoprazole (PROTONIX) injection 40 mg (has no  administration in time range)  ? ? ?ED Course/ Medical Decision Making/ A&P ?  ?                        ?Medical Decision Making ?Amount and/or Complexity of Data Reviewed ?Labs: ordered. ?Radiology: ordered. ? ?Risk ?Prescription drug management. ? ? ?Persistent epigastric pain, nausea, vomiting.  Old records reviewed confirming to recent ED visits for abdominal pain and vomiting.  WBC was normal on both occasions, glucose was borderline and lipase was minimally elevated.  Will repeat labs and give IV fluids and promethazine as well as starting pantoprazole.  Etiologies include, but are not limited to, alcoholic gastritis, viral gastritis, GERD, pancreatitis, cholecystitis, cannabinoid hyperemesis syndrome, cyclic vomiting syndrome.  If he does not respond to medication in the ED, may need to consider imaging studies. ? ?Patient feels minimally improved with above-noted treatment.  He continues to have nausea.  CBC, metabolic panel, lipase are unremarkable.  Given persistence of symptoms and failure to respond to treatment, I do feel that additional testing is warranted and CT of abdomen and pelvis is ordered.  Case is signed out to Dr. Durwin Nora. ? ?Final Clinical Impression(s) / ED Diagnoses ?Final diagnoses:  ?Epigastric pain  ?Non-intractable vomiting with nausea  ? ? ?Rx / DC Orders ?ED Discharge Orders   ? ? None  ? ?  ? ? ?  ?Dione Booze, MD ?03/01/22 0730 ? ?

## 2022-03-01 NOTE — ED Provider Notes (Signed)
Care of patient assumed from Dr. Preston Fleeting at 7 AM.  This is the patient's 3rd recent ED visit for upper abd pain and nausea.  He has a tender epigastrium.  WBC is normal. Other labs are pending.  Follow-up on results of lab work and CT scan.  He has gotten Phenergan and IVF. ?Physical Exam  ?BP (!) 145/85   Pulse (!) 48   Temp 98.8 ?F (37.1 ?C) (Oral)   Resp 16   Ht 5\' 10"  (1.778 m)   Wt 74.8 kg   SpO2 99%   BMI 23.68 kg/m?  ? ?Physical Exam ?Vitals and nursing note reviewed.  ?Constitutional:   ?   General: He is not in acute distress. ?   Appearance: He is well-developed. He is not toxic-appearing or diaphoretic.  ?HENT:  ?   Head: Normocephalic and atraumatic.  ?   Mouth/Throat:  ?   Mouth: Mucous membranes are moist.  ?   Pharynx: Oropharynx is clear.  ?Eyes:  ?   Conjunctiva/sclera: Conjunctivae normal.  ?Cardiovascular:  ?   Rate and Rhythm: Normal rate and regular rhythm.  ?   Heart sounds: No murmur heard. ?Pulmonary:  ?   Effort: Pulmonary effort is normal. No respiratory distress.  ?Abdominal:  ?   General: There is no distension.  ?Musculoskeletal:     ?   General: No swelling.  ?   Cervical back: Neck supple.  ?Skin: ?   General: Skin is warm and dry.  ?   Capillary Refill: Capillary refill takes less than 2 seconds.  ?Neurological:  ?   General: No focal deficit present.  ?   Mental Status: He is alert and oriented to person, place, and time.  ?Psychiatric:     ?   Mood and Affect: Mood normal.     ?   Behavior: Behavior normal.  ? ? ?Procedures  ?Procedures ? ?ED Course / MDM  ?  ?Medical Decision Making ?Amount and/or Complexity of Data Reviewed ?Labs: ordered. ?Radiology: ordered. ? ?Risk ?OTC drugs. ?Prescription drug management. ? ? ?On initial assessment, patient reports continued nausea.  Zofran was ordered.  Patient underwent a CT scan of the abdomen pelvis.  Results of the scan showed some periportal edema.  This finding is nonspecific and, in the setting of normal hepatobiliary enzymes,  low suspicion for hepatitis, congestive hepatopathy, or cholangitis.  On reassessment, patient reports continued resolution of epigastric pain.  He does continue to have mild nausea but has been able to tolerate p.o. intake here in the ED.  He denies any smoking or frequent NSAID use.  He does frequently drink alcohol.  I suspect etiology is gastritis/PUD.  GI cocktail was ordered.  On further reassessment, patient reports significant improvement in symptoms following GI cocktail.  Patient to be prescribed acid blocking medication.  He was advised he discontinue alcohol use.  He was discharged in good condition. ? ? ? ? ?  ? , MD ?03/01/22 1010 ? ?

## 2022-03-15 ENCOUNTER — Ambulatory Visit: Payer: Medicaid Other | Admitting: Family

## 2022-07-23 ENCOUNTER — Other Ambulatory Visit (HOSPITAL_COMMUNITY)
Admission: EM | Admit: 2022-07-23 | Discharge: 2022-07-27 | Disposition: A | Discharge status: Home / Self Care | Payer: Medicaid Other | Attending: Psychiatry | Admitting: Psychiatry

## 2022-07-23 ENCOUNTER — Other Ambulatory Visit: Payer: Self-pay

## 2022-07-23 ENCOUNTER — Encounter (HOSPITAL_COMMUNITY): Payer: Self-pay | Admitting: Behavioral Health

## 2022-07-23 DIAGNOSIS — Z20822 Contact with and (suspected) exposure to covid-19: Secondary | ICD-10-CM | POA: Insufficient documentation

## 2022-07-23 DIAGNOSIS — F1090 Alcohol use, unspecified, uncomplicated: Secondary | ICD-10-CM | POA: Diagnosis present

## 2022-07-23 DIAGNOSIS — F4310 Post-traumatic stress disorder, unspecified: Secondary | ICD-10-CM | POA: Diagnosis not present

## 2022-07-23 DIAGNOSIS — F331 Major depressive disorder, recurrent, moderate: Secondary | ICD-10-CM | POA: Insufficient documentation

## 2022-07-23 DIAGNOSIS — R45851 Suicidal ideations: Secondary | ICD-10-CM

## 2022-07-23 DIAGNOSIS — Z9151 Personal history of suicidal behavior: Secondary | ICD-10-CM

## 2022-07-23 DIAGNOSIS — F332 Major depressive disorder, recurrent severe without psychotic features: Secondary | ICD-10-CM | POA: Diagnosis present

## 2022-07-23 DIAGNOSIS — Z9152 Personal history of nonsuicidal self-harm: Secondary | ICD-10-CM | POA: Diagnosis present

## 2022-07-23 DIAGNOSIS — F329 Major depressive disorder, single episode, unspecified: Secondary | ICD-10-CM | POA: Diagnosis present

## 2022-07-23 LAB — CBC WITH DIFFERENTIAL/PLATELET
Abs Immature Granulocytes: 0.02 10*3/uL (ref 0.00–0.07)
Basophils Absolute: 0.1 10*3/uL (ref 0.0–0.1)
Basophils Relative: 1 %
Eosinophils Absolute: 0.2 10*3/uL (ref 0.0–0.5)
Eosinophils Relative: 3 %
HCT: 36.2 % — ABNORMAL LOW (ref 39.0–52.0)
Hemoglobin: 12.3 g/dL — ABNORMAL LOW (ref 13.0–17.0)
Immature Granulocytes: 0 %
Lymphocytes Relative: 31 %
Lymphs Abs: 2.7 10*3/uL (ref 0.7–4.0)
MCH: 28.9 pg (ref 26.0–34.0)
MCHC: 34 g/dL (ref 30.0–36.0)
MCV: 85 fL (ref 80.0–100.0)
Monocytes Absolute: 0.6 10*3/uL (ref 0.1–1.0)
Monocytes Relative: 7 %
Neutro Abs: 5.3 10*3/uL (ref 1.7–7.7)
Neutrophils Relative %: 58 %
Platelets: 277 10*3/uL (ref 150–400)
RBC: 4.26 MIL/uL (ref 4.22–5.81)
RDW: 13.6 % (ref 11.5–15.5)
WBC: 8.9 10*3/uL (ref 4.0–10.5)
nRBC: 0 % (ref 0.0–0.2)

## 2022-07-23 LAB — COMPREHENSIVE METABOLIC PANEL
ALT: 23 U/L (ref 0–44)
AST: 24 U/L (ref 15–41)
Albumin: 4.6 g/dL (ref 3.5–5.0)
Alkaline Phosphatase: 45 U/L (ref 38–126)
Anion gap: 11 (ref 5–15)
BUN: 10 mg/dL (ref 6–20)
CO2: 24 mmol/L (ref 22–32)
Calcium: 9.6 mg/dL (ref 8.9–10.3)
Chloride: 107 mmol/L (ref 98–111)
Creatinine, Ser: 0.81 mg/dL (ref 0.61–1.24)
GFR, Estimated: >60 mL/min (ref >60)
Glucose, Bld: 87 mg/dL (ref 70–99)
Potassium: 3.4 mmol/L — ABNORMAL LOW (ref 3.5–5.1)
Sodium: 142 mmol/L (ref 135–145)
Total Bilirubin: 0.7 mg/dL (ref 0.3–1.2)
Total Protein: 7 g/dL (ref 6.5–8.1)

## 2022-07-23 LAB — POCT URINE DRUG SCREEN - MANUAL ENTRY (I-SCREEN)
POC Amphetamine UR: NOT DETECTED
POC Buprenorphine (BUP): NOT DETECTED
POC Cocaine UR: NOT DETECTED
POC Marijuana UR: POSITIVE — AB
POC Methadone UR: NOT DETECTED
POC Methamphetamine UR: NOT DETECTED
POC Morphine: NOT DETECTED
POC Oxazepam (BZO): NOT DETECTED
POC Oxycodone UR: NOT DETECTED
POC Secobarbital (BAR): NOT DETECTED

## 2022-07-23 LAB — LIPID PANEL
Cholesterol: 138 mg/dL (ref 0–200)
HDL: 60 mg/dL (ref >40)
LDL Cholesterol: 68 mg/dL (ref 0–99)
Total CHOL/HDL Ratio: 2.3 RATIO
Triglycerides: 52 mg/dL (ref <150)
VLDL: 10 mg/dL (ref 0–40)

## 2022-07-23 LAB — HEMOGLOBIN A1C
Hgb A1c MFr Bld: 5.5 % (ref 4.8–5.6)
Mean Plasma Glucose: 111.15 mg/dL

## 2022-07-23 LAB — RESP PANEL BY RT-PCR (FLU A&B, COVID) ARPGX2
Influenza A by PCR: NEGATIVE
Influenza B by PCR: NEGATIVE
SARS Coronavirus 2 by RT PCR: NEGATIVE

## 2022-07-23 LAB — POC SARS CORONAVIRUS 2 AG: SARSCOV2ONAVIRUS 2 AG: NEGATIVE

## 2022-07-23 LAB — TSH: TSH: 0.875 u[IU]/mL (ref 0.350–4.500)

## 2022-07-23 MED ORDER — ESCITALOPRAM OXALATE 10 MG PO TABS
10.0000 mg | ORAL_TABLET | Freq: Every day | ORAL | Status: DC
  Administered 2022-07-23 – 2022-07-24 (×2): 10 mg via ORAL
  Filled 2022-07-23 (×2): qty 1

## 2022-07-23 MED ORDER — ALUM & MAG HYDROXIDE-SIMETH 200-200-20 MG/5ML PO SUSP: 30.0000 mL | ORAL | Status: DC | PRN

## 2022-07-23 MED ORDER — ACETAMINOPHEN 325 MG PO TABS: 650.0000 mg | ORAL_TABLET | Freq: Four times a day (QID) | ORAL | Status: DC | PRN

## 2022-07-23 MED ORDER — HYDROXYZINE HCL 25 MG PO TABS
25.0000 mg | ORAL_TABLET | Freq: Three times a day (TID) | ORAL | Status: DC | PRN
  Administered 2022-07-26: 25 mg via ORAL
  Filled 2022-07-23: qty 1

## 2022-07-23 MED ORDER — MAGNESIUM HYDROXIDE 400 MG/5ML PO SUSP: 30.0000 mL | Freq: Every day | ORAL | Status: DC | PRN
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
## 2022-07-23 NOTE — ED Provider Notes (Signed)
Facility Based Crisis Admission H&P  Date: 07/23/22 Patient Name: Victor Stanley MRN: [id number] Chief Complaint: No chief complaint on file.     Diagnoses:  Final diagnoses:  Moderate episode of recurrent major depressive disorder (HCC)  Suicidal ideation    HPI: Victor Stanley is a 21 year old male patient with a psychiatric history of depression, PTSD, and alcohol abuse, who presented to [facility] voluntarily accompanied by [employer] after his mother called the police with reports that he was feeling depressed and suicidal.   Patient seen and reevaluated face-to-face by this provider, chart reviewed and case discussed with Dr. Lucianne Muss. On evaluation, patient is alert and oriented x 4. His thought process is logical and speech is clear and coherent. His mood is depressed and affect is congruent. Today, patient denies suicidal ideations. He states that yesterday he had a couple of suicidal thoughts and thoughts to cut his wrists. He denies a suicide plan or intent at that time. He reports multiple past suicide attempts and states that he was on suicide watch while in jail from June until July. He denies self-injurious behaviors. He states that in the past he used to self-harm by cutting. He denies homicidal ideations. He denies auditory or visual hallucinations.There is no objective evidence that the patient is currently responding to internal or external stimuli.  Today, he denies feeling depressed however, he reports worsening depressive symptoms yesterday and describes those symptoms as feelings of sadness, hopelessness, worthlessness, crying spells, guilt and irritability. He is unable to identify any specific triggers but expresses feeling guilty for assaulting his girlfriend at the time and going to jail. He states that he is currently on probation and is ready for a change. He reports tolerating the lexapro without any side effects. He states that he was start on lexapro in jail and felt  really great. He states that he was not provided with any paperwork or prescriptions for follow up when he was released from jail on 8/30. He reports a medical history of asthma as a child. He denies any physical complaints at this time. Vital signs are stable.   PHQ 2-9:   Flowsheet Row ED from 07/23/2022 in [facility] ED from 03/01/2022 in [facility] EMERGENCY DEPARTMENT ED from 02/28/2022 in [facility]  C-SSRS RISK CATEGORY No Risk No Risk No Risk        Total Time spent with patient: 20 minutes  Musculoskeletal  Strength & Muscle Tone: within normal limits Gait & Station: normal Patient leans: N/A  Psychiatric Specialty Exam  Presentation General Appearance: Appropriate for Environment  Eye Contact:Fair  Speech:Clear and Coherent  Speech Volume:Normal  Handedness:Right   Mood and Affect  Mood:Depressed  Affect:Congruent   Thought Process  Thought Processes:Coherent  Descriptions of Associations:Intact  Orientation:Full (Time, Place and Person)  Thought Content:Logical    Hallucinations:Hallucinations: None  Ideas of Reference:None  Suicidal Thoughts:Suicidal Thoughts: No SI Passive Intent and/or Plan: Without Intent; Without Plan  Homicidal Thoughts:Homicidal Thoughts: No   Sensorium  Memory:Immediate Fair; Recent Fair; Remote Fair  Judgment:Intact  Insight:Fair   Executive Functions  Concentration:Fair  Attention Span:Fair  Recall:Fair  Fund of Knowledge:Fair  Language:Fair   Psychomotor Activity  Psychomotor Activity:Psychomotor Activity: Normal   Assets  Assets:Communication Skills; Desire for Improvement; Housing; Leisure Time; Physical Health; Social Support   Sleep  Sleep:Sleep: Fair   Nutritional Assessment (For OBS and FBC admissions only) Has the patient had a weight loss or gain of 10  pounds or more in the last 3 months?: No Has the  patient had a decrease in food intake/or appetite?: No Does the patient have dental problems?: No Does the patient have eating habits or behaviors that may be indicators of an eating disorder including binging or inducing vomiting?: No Has the patient recently lost weight without trying?: 0 Has the patient been eating poorly because of a decreased appetite?: 0 Malnutrition Screening Tool Score: 0    Physical Exam HENT:     Head: Normocephalic.     Nose: Nose normal.  Eyes:     Conjunctiva/sclera: Conjunctivae normal.  Cardiovascular:     Rate and Rhythm: Normal rate.  Pulmonary:     Effort: Pulmonary effort is normal.  Musculoskeletal:        General: Normal range of motion.     Cervical back: Normal range of motion.  Neurological:     Mental Status: He is alert and oriented to person, place, and time.    Review of Systems  Constitutional: Negative.   HENT: Negative.    Eyes: Negative.   Respiratory: Negative.    Cardiovascular: Negative.   Gastrointestinal: Negative.   Genitourinary: Negative.   Musculoskeletal: Negative.   Neurological: Negative.   Endo/Heme/Allergies: Negative.     Blood pressure (!) 108/59, pulse 92, temperature 97.6 F (36.4 C), temperature source Oral, resp. rate 19, SpO2 99 %. There is no height or weight on file to calculate BMI.  Past Psychiatric History: psychiatric history of depression, PTSD, alcohol abuse, and reports of multiple past suicide attempts. Prescribed Prozac at age 32 and stopped taking it at age 11.  Is the patient at risk to self? Yes  Has the patient been a risk to self in the past 6 months? Yes .    Has the patient been a risk to self within the distant past? Yes   Is the patient a risk to others? No   Has the patient been a risk to others in the past 6 months? No   Has the patient been a risk to others within the distant past? No   Past Medical History:  Past Medical History:  Diagnosis Date   Allergy    Asthma     Depression    PTSD (post-traumatic stress disorder)    No past surgical history on file.  Family History: Reports mother has bipolar   Social History:  Social History   Socioeconomic History   Marital status: Single    Spouse name: Not on file   Number of children: Not on file   Years of education: Not on file   Highest education level: Not on file  Occupational History   Not on file  Tobacco Use   Smoking status: Never    Passive exposure: Yes   Smokeless tobacco: Not on file  Substance and Sexual Activity   Alcohol use: Yes    Comment: binge drinking   Drug use: No   Sexual activity: Never  Other Topics Concern   Not on file  Social History Narrative   Not on file   Social Determinants of Health   Financial Resource Strain: Not on file  Food Insecurity: Not on file  Transportation Needs: Not on file  Physical Activity: Not on file  Stress: Not on file  Social Connections: Not on file  Intimate Partner Violence: Not on file    SDOH:  SDOH Screenings   Alcohol Screen: Not on file  Depression (ZOX0-9): Not on file  Financial Resource Strain: Not on file  Food Insecurity: Not on file  Housing: Not on file  Physical Activity: Not on file  Social Connections: Not on file  Stress: Not on file  Tobacco Use: Medium Risk (03/01/2022)   Patient History    Smoking Tobacco Use: Never    Smokeless Tobacco Use: Unknown    Passive Exposure: Yes  Transportation Needs: Not on file    Last Labs:  Admission on 07/23/2022  Component Date Value Ref Range Status   SARS Coronavirus 2 by RT PCR 07/23/2022 NEGATIVE  NEGATIVE Final   Comment: (NOTE) SARS-CoV-2 target nucleic acids are NOT DETECTED.  The SARS-CoV-2 RNA is generally detectable in upper respiratory specimens during the acute phase of infection. The lowest concentration of SARS-CoV-2 viral copies this assay can detect is 138 copies/mL. A negative result does not preclude SARS-Cov-2 infection and should not  be used as the sole basis for treatment or other patient management decisions. A negative result may occur with  improper specimen collection/handling, submission of specimen other than nasopharyngeal swab, presence of viral mutation(s) within the areas targeted by this assay, and inadequate number of viral copies(<138 copies/mL). A negative result must be combined with clinical observations, patient history, and epidemiological information. The expected result is Negative.  Fact Sheet for Patients:  BloggerCourse.com  Fact Sheet for Healthcare Providers:  SeriousBroker.it  This test is no                          t yet approved or cleared by the [place] FDA and  has been authorized for detection and/or diagnosis of SARS-CoV-2 by FDA under an Emergency Use Authorization (EUA). This EUA will remain  in effect (meaning this test can be used) for the duration of the COVID-19 declaration under Section 564(b)(1) of the Act, 21 U.S.C.section 360bbb-3(b)(1), unless the authorization is terminated  or revoked sooner.       Influenza A by PCR 07/23/2022 NEGATIVE  NEGATIVE Final   Influenza B by PCR 07/23/2022 NEGATIVE  NEGATIVE Final   Comment: (NOTE) The Xpert Xpress SARS-CoV-2/FLU/RSV plus assay is intended as an aid in the diagnosis of influenza from Nasopharyngeal swab specimens and should not be used as a sole basis for treatment. Nasal washings and aspirates are unacceptable for Xpert Xpress SARS-CoV-2/FLU/RSV testing.  Fact Sheet for Patients: BloggerCourse.com  Fact Sheet for Healthcare Providers: SeriousBroker.it  This test is not yet approved or cleared by the [place] FDA and has been authorized for detection and/or diagnosis of SARS-CoV-2 by FDA under an Emergency Use Authorization (EUA). This EUA will remain in effect (meaning this test can be used) for the  duration of the COVID-19 declaration under Section 564(b)(1) of the Act, 21 U.S.C. section 360bbb-3(b)(1), unless the authorization is terminated or revoked.  Performed at [facility], [address], [place], [place] [address]    WBC 07/23/2022 8.9  4.0 - 10.5 K/uL Final   RBC 07/23/2022 4.26  4.22 - 5.81 MIL/uL Final   Hemoglobin 07/23/2022 12.3 (L)  13.0 - 17.0 g/dL Final   HCT 60/45/4098 36.2 (L)  39.0 - 52.0 % Final   MCV 07/23/2022 85.0  80.0 - 100.0 fL Final   MCH 07/23/2022 28.9  26.0 - 34.0 pg Final   MCHC 07/23/2022 34.0  30.0 - 36.0 g/dL Final   RDW 11/91/4782 13.6  11.5 - 15.5 % Final   Platelets 07/23/2022 277  150 - 400  K/uL Final   nRBC 07/23/2022 0.0  0.0 - 0.2 % Final   Neutrophils Relative % 07/23/2022 58  % Final   Neutro Abs 07/23/2022 5.3  1.7 - 7.7 K/uL Final   Lymphocytes Relative 07/23/2022 31  % Final   Lymphs Abs 07/23/2022 2.7  0.7 - 4.0 K/uL Final   Monocytes Relative 07/23/2022 7  % Final   Monocytes Absolute 07/23/2022 0.6  0.1 - 1.0 K/uL Final   Eosinophils Relative 07/23/2022 3  % Final   Eosinophils Absolute 07/23/2022 0.2  0.0 - 0.5 K/uL Final   Basophils Relative 07/23/2022 1  % Final   Basophils Absolute 07/23/2022 0.1  0.0 - 0.1 K/uL Final   Immature Granulocytes 07/23/2022 0  % Final   Abs Immature Granulocytes 07/23/2022 0.02  0.00 - 0.07 K/uL Final   Performed at [facility], [address], [place], [place] [address]   Sodium 07/23/2022 142  135 - 145 mmol/L Final   Potassium 07/23/2022 3.4 (L)  3.5 - 5.1 mmol/L Final   Chloride 07/23/2022 107  98 - 111 mmol/L Final   CO2 07/23/2022 24  22 - 32 mmol/L Final   Glucose, Bld 07/23/2022 87  70 - 99 mg/dL Final   Glucose reference range applies only to samples taken after fasting for at least 8 hours.   BUN 07/23/2022 10  6 - 20 mg/dL Final   Creatinine, Ser 07/23/2022 0.81  0.61 - 1.24 mg/dL Final   Calcium 54/62/7035 9.6  8.9 - 10.3 mg/dL Final   Total Protein 00/93/8182 7.0   6.5 - 8.1 g/dL Final   Albumin 99/37/1696 4.6  3.5 - 5.0 g/dL Final   AST 78/93/8101 24  15 - 41 U/L Final   ALT 07/23/2022 23  0 - 44 U/L Final   Alkaline Phosphatase 07/23/2022 45  38 - 126 U/L Final   Total Bilirubin 07/23/2022 0.7  0.3 - 1.2 mg/dL Final   GFR, Estimated 07/23/2022 >60  >60 mL/min Final   Comment: (NOTE) Calculated using the CKD-EPI Creatinine Equation (2021)    Anion gap 07/23/2022 11  5 - 15 Final   Performed at [facility], [address] [place], [place] [address]   Hgb A1c MFr Bld 07/23/2022 5.5  4.8 - 5.6 % Final   Comment: (NOTE) Pre diabetes:          5.7%-6.4%  Diabetes:              >6.4%  Glycemic control for   <7.0% adults with diabetes    Mean Plasma Glucose 07/23/2022 111.15  mg/dL Final   Performed at [facility], [address] [place], [place] [address]   Cholesterol 07/23/2022 138  0 - 200 mg/dL Final   Triglycerides 78/24/2353 52  <150 mg/dL Final   HDL 61/44/3154 60  >40 mg/dL Final   Total CHOL/HDL Ratio 07/23/2022 2.3  RATIO Final   VLDL 07/23/2022 10  0 - 40 mg/dL Final   LDL Cholesterol 07/23/2022 68  0 - 99 mg/dL Final   Comment:        Total Cholesterol/HDL:CHD Risk Coronary Heart Disease Risk Table                     Men   Women  1/2 Average Risk   3.4   3.3  Average Risk       5.0   4.4  2 X Average Risk   9.6  7.1  3 X Average Risk  23.4   11.0        Use the calculated Patient Ratio above and the CHD Risk Table to determine the patient's CHD Risk.        ATP III CLASSIFICATION (LDL):  <100     mg/dL   Optimal  [id number]  mg/dL   Near or Above                    Optimal  130-159  mg/dL   Borderline  [id number]  mg/dL   High  >811>190     mg/dL   Very High Performed at [facility], [address] [place], [place] [address]    TSH 07/23/2022 0.875  0.350 - 4.500 uIU/mL Final   Comment: Performed by a 3rd Generation assay with a functional sensitivity of <=0.01 uIU/mL. Performed at [facility], [address] [place], [place] [address]    POC Amphetamine UR 07/23/2022 None Detected  NONE DETECTED (Cut Off Level 1000 ng/mL) Final   POC Secobarbital (BAR) 07/23/2022 None Detected  NONE DETECTED (Cut Off Level 300 ng/mL) Final   POC Buprenorphine (BUP) 07/23/2022 None Detected  NONE DETECTED (Cut Off Level 10 ng/mL) Final   POC Oxazepam (BZO) 07/23/2022 None Detected  NONE DETECTED (Cut Off Level 300 ng/mL) Final   POC Cocaine UR 07/23/2022 None Detected  NONE DETECTED (Cut Off Level 300 ng/mL) Final   POC Methamphetamine UR 07/23/2022 None Detected  NONE DETECTED (Cut Off Level 1000 ng/mL) Final   POC Morphine 07/23/2022 None Detected  NONE DETECTED (Cut Off Level 300 ng/mL) Final   POC Methadone UR 07/23/2022 None Detected  NONE DETECTED (Cut Off Level 300 ng/mL) Final   POC Oxycodone UR 07/23/2022 None Detected  NONE DETECTED (Cut Off Level 100 ng/mL) Final   POC Marijuana UR 07/23/2022 Positive (A)  NONE DETECTED (Cut Off Level 50 ng/mL) Final   SARSCOV2ONAVIRUS 2 AG 07/23/2022 NEGATIVE  NEGATIVE Final   Comment: (NOTE) SARS-CoV-2 antigen NOT DETECTED.   Negative results are presumptive.  Negative results do not preclude SARS-CoV-2 infection and should not be used as the sole basis for treatment or other patient management decisions, including infection  control decisions, particularly in the presence of clinical signs and  symptoms consistent with COVID-19, or in those who have been in contact with the virus.  Negative results must be combined with clinical observations, patient history, and epidemiological information. The expected result is Negative.  Fact Sheet for Patients: https://www.jennings-kim.com/https://www.fda.gov/media/141569/download  Fact Sheet for Healthcare Providers: https://alexander-rogers.biz/https://www.fda.gov/media/141568/download  This test is not yet approved or cleared by the [place] FDA and  has been authorized for detection and/or diagnosis of SARS-CoV-2 by FDA under an Emergency Use  Authorization (EUA).  This EUA will remain in effect (meaning this test can be used) for the duration of  the COV                          ID-19 declaration under Section 564(b)(1) of the Act, 21 U.S.C. section 360bbb-3(b)(1), unless the authorization is terminated or revoked sooner.    Admission on 03/01/2022, Discharged on 03/01/2022  Component Date Value Ref Range Status   Sodium 03/01/2022 139  135 - 145 mmol/L Final   Potassium 03/01/2022 3.6  3.5 - 5.1 mmol/L Final   Chloride 03/01/2022 106  98 - 111 mmol/L Final   CO2 03/01/2022 25  22 -  32 mmol/L Final   Glucose, Bld 03/01/2022 122 (H)  70 - 99 mg/dL Final   Glucose reference range applies only to samples taken after fasting for at least 8 hours.   BUN 03/01/2022 9  6 - 20 mg/dL Final   Creatinine, Ser 03/01/2022 0.99  0.61 - 1.24 mg/dL Final   Calcium 16/08/9603 8.9  8.9 - 10.3 mg/dL Final   Total Protein 54/07/8118 7.0  6.5 - 8.1 g/dL Final   Albumin 14/78/2956 4.0  3.5 - 5.0 g/dL Final   AST 21/30/8657 22  15 - 41 U/L Final   ALT 03/01/2022 22  0 - 44 U/L Final   Alkaline Phosphatase 03/01/2022 50  38 - 126 U/L Final   Total Bilirubin 03/01/2022 0.4  0.3 - 1.2 mg/dL Final   GFR, Estimated 03/01/2022 >60  >60 mL/min Final   Comment: (NOTE) Calculated using the CKD-EPI Creatinine Equation (2021)    Anion gap 03/01/2022 8  5 - 15 Final   Performed at [facility], [address] [place], [place] [address]   Lipase 03/01/2022 34  11 - 51 U/L Final   Performed at [facility], [address] [place], [place] [address]   WBC 03/01/2022 5.8  4.0 - 10.5 K/uL Final   RBC 03/01/2022 4.66  4.22 - 5.81 MIL/uL Final   Hemoglobin 03/01/2022 13.3  13.0 - 17.0 g/dL Final   HCT 41/32/4401 40.4  39.0 - 52.0 % Final   MCV 03/01/2022 86.7  80.0 - 100.0 fL Final   MCH 03/01/2022 28.5  26.0 - 34.0 pg Final   MCHC 03/01/2022 32.9  30.0 - 36.0 g/dL Final   RDW 02/72/5366 13.9  11.5 - 15.5 % Final   Platelets 03/01/2022 236   150 - 400 K/uL Final   nRBC 03/01/2022 0.0  0.0 - 0.2 % Final   Neutrophils Relative % 03/01/2022 59  % Final   Neutro Abs 03/01/2022 3.4  1.7 - 7.7 K/uL Final   Lymphocytes Relative 03/01/2022 32  % Final   Lymphs Abs 03/01/2022 1.9  0.7 - 4.0 K/uL Final   Monocytes Relative 03/01/2022 6  % Final   Monocytes Absolute 03/01/2022 0.3  0.1 - 1.0 K/uL Final   Eosinophils Relative 03/01/2022 0  % Final   Eosinophils Absolute 03/01/2022 0.0  0.0 - 0.5 K/uL Final   Basophils Relative 03/01/2022 3  % Final   Basophils Absolute 03/01/2022 0.2 (H)  0.0 - 0.1 K/uL Final   nRBC 03/01/2022 0  0 /100 WBC Final   Abs Immature Granulocytes 03/01/2022 0.00  0.00 - 0.07 K/uL Final   Performed at [facility], [address] [place], [place] [address]  Admission on 02/28/2022, Discharged on 02/28/2022  Component Date Value Ref Range Status   WBC 02/28/2022 6.3  4.0 - 10.5 K/uL Final   RBC 02/28/2022 5.18  4.22 - 5.81 MIL/uL Final   Hemoglobin 02/28/2022 14.6  13.0 - 17.0 g/dL Final   HCT 74/25/9563 44.0  39.0 - 52.0 % Final   MCV 02/28/2022 84.9  80.0 - 100.0 fL Final   MCH 02/28/2022 28.2  26.0 - 34.0 pg Final   MCHC 02/28/2022 33.2  30.0 - 36.0 g/dL Final   RDW 87/56/4332 13.8  11.5 - 15.5 % Final   Platelets 02/28/2022 267  150 - 400 K/uL Final   nRBC 02/28/2022 0.0  0.0 - 0.2 % Final   Neutrophils Relative % 02/28/2022 56  % Final  Neutro Abs 02/28/2022 3.6  1.7 - 7.7 K/uL Final   Lymphocytes Relative 02/28/2022 30  % Final   Lymphs Abs 02/28/2022 1.9  0.7 - 4.0 K/uL Final   Monocytes Relative 02/28/2022 12  % Final   Monocytes Absolute 02/28/2022 0.8  0.1 - 1.0 K/uL Final   Eosinophils Relative 02/28/2022 0  % Final   Eosinophils Absolute 02/28/2022 0.0  0.0 - 0.5 K/uL Final   Basophils Relative 02/28/2022 1  % Final   Basophils Absolute 02/28/2022 0.1  0.0 - 0.1 K/uL Final   Immature Granulocytes 02/28/2022 1  % Final   Abs Immature Granulocytes 02/28/2022 0.03  0.00 - 0.07 K/uL  Final   Reactive, Benign Lymphocytes 02/28/2022 PRESENT   Final   Performed at [facility], [address], [place], [place] [address]   Sodium 02/28/2022 141  135 - 145 mmol/L Final   Potassium 02/28/2022 3.4 (L)  3.5 - 5.1 mmol/L Final   Chloride 02/28/2022 103  98 - 111 mmol/L Final   CO2 02/28/2022 27  22 - 32 mmol/L Final   Glucose, Bld 02/28/2022 130 (H)  70 - 99 mg/dL Final   Glucose reference range applies only to samples taken after fasting for at least 8 hours.   BUN 02/28/2022 13  6 - 20 mg/dL Final   Creatinine, Ser 02/28/2022 1.10  0.61 - 1.24 mg/dL Final   Calcium 38/75/6433 9.9  8.9 - 10.3 mg/dL Final   Total Protein 29/51/8841 8.5 (H)  6.5 - 8.1 g/dL Final   Albumin 66/04/3015 4.7  3.5 - 5.0 g/dL Final   AST 11/30/3233 27  15 - 41 U/L Final   ALT 02/28/2022 25  0 - 44 U/L Final   Alkaline Phosphatase 02/28/2022 62  38 - 126 U/L Final   Total Bilirubin 02/28/2022 0.6  0.3 - 1.2 mg/dL Final   GFR, Estimated 02/28/2022 >60  >60 mL/min Final   Comment: (NOTE) Calculated using the CKD-EPI Creatinine Equation (2021)    Anion gap 02/28/2022 11  5 - 15 Final   Performed at [facility], [address], [place], [place] [address]   Lipase 02/28/2022 54 (H)  11 - 51 U/L Final   Performed at [facility], [address], [place], [place] [address]   Alcohol, Ethyl (B) 02/28/2022 <10  <10 mg/dL Final   Comment: (NOTE) Lowest detectable limit for serum alcohol is 10 mg/dL.  For medical purposes only. Performed at [facility], [address], [place], [place] [address]    Total CK 02/28/2022 85  49 - 397 U/L Final   Performed at [facility], [address] [place], [place] [address]   Magnesium 02/28/2022 2.3  1.7 - 2.4 mg/dL Final   Performed at [facility], [address], [place], [place] [address]  Admission on 02/27/2022, Discharged on 02/27/2022   Component Date Value Ref Range Status   WBC 02/27/2022 6.2  4.0 - 10.5 K/uL Final   RBC 02/27/2022 5.23  4.22 - 5.81 MIL/uL Final   Hemoglobin 02/27/2022 15.0  13.0 - 17.0 g/dL Final   HCT 16/05/3709 44.5  39.0 - 52.0 % Final   MCV 02/27/2022 85.1  80.0 - 100.0 fL Final   MCH 02/27/2022 28.7  26.0 - 34.0 pg Final   MCHC 02/27/2022 33.7  30.0 - 36.0 g/dL Final   RDW 62/69/4854 13.7  11.5 - 15.5 % Final   Platelets 02/27/2022 243  150 - 400 K/uL Final   nRBC 02/27/2022 0.0  0.0 - 0.2 % Final   Neutrophils Relative % 02/27/2022 66  % Final   Neutro Abs 02/27/2022 4.1  1.7 - 7.7 K/uL Final   Lymphocytes Relative 02/27/2022 27  % Final   Lymphs Abs 02/27/2022 1.7  0.7 - 4.0 K/uL Final   Monocytes Relative 02/27/2022 3  % Final   Monocytes Absolute 02/27/2022 0.2  0.1 - 1.0 K/uL Final   Eosinophils Relative 02/27/2022 3  % Final   Eosinophils Absolute 02/27/2022 0.2  0.0 - 0.5 K/uL Final   Basophils Relative 02/27/2022 1  % Final   Basophils Absolute 02/27/2022 0.1  0.0 - 0.1 K/uL Final   WBC Morphology 02/27/2022 MORPHOLOGY UNREMARKABLE   Final   RBC Morphology 02/27/2022 MORPHOLOGY UNREMARKABLE   Final   Smear Review 02/27/2022 MORPHOLOGY UNREMARKABLE   Final   Immature Granulocytes 02/27/2022 0  % Final   Abs Immature Granulocytes 02/27/2022 0.02  0.00 - 0.07 K/uL Final   Performed at [facility], [address] [place], [place] [address]   Sodium 02/27/2022 140  135 - 145 mmol/L Final   Potassium 02/27/2022 4.3  3.5 - 5.1 mmol/L Final   Chloride 02/27/2022 105  98 - 111 mmol/L Final   CO2 02/27/2022 22  22 - 32 mmol/L Final   Glucose, Bld 02/27/2022 140 (H)  70 - 99 mg/dL Final   Glucose reference range applies only to samples taken after fasting for at least 8 hours.   BUN 02/27/2022 11  6 - 20 mg/dL Final   Creatinine, Ser 02/27/2022 1.05  0.61 - 1.24 mg/dL Final   Calcium 47/82/9562 9.9  8.9 - 10.3 mg/dL Final   GFR, Estimated 02/27/2022 >60  >60 mL/min Final    Comment: (NOTE) Calculated using the CKD-EPI Creatinine Equation (2021)    Anion gap 02/27/2022 13  5 - 15 Final   Performed at [facility], [address] [place], [place] [address]   Glucose-Capillary 02/27/2022 132 (H)  70 - 99 mg/dL Final   Glucose reference range applies only to samples taken after fasting for at least 8 hours.   Comment 1 02/27/2022 Notify RN   Final   Comment 2 02/27/2022 Document in Chart   Final   Troponin I (High Sensitivity) 02/27/2022 3  <18 ng/L Final   Comment: (NOTE) Elevated high sensitivity troponin I (hsTnI) values and significant  changes across serial measurements may suggest ACS but many other  chronic and acute conditions are known to elevate hsTnI results.  Refer to the "Links" section for chest pain algorithms and additional  guidance. Performed at [facility], [address], [place], [place] [address]    SARS Coronavirus 2 by RT PCR 02/27/2022 NEGATIVE  NEGATIVE Final   Comment: (NOTE) SARS-CoV-2 target nucleic acids are NOT DETECTED.  The SARS-CoV-2 RNA is generally detectable in upper respiratory specimens during the acute phase of infection. The lowest concentration of SARS-CoV-2 viral copies this assay can detect is 138 copies/mL. A negative result does not preclude SARS-Cov-2 infection and should not be used as the sole basis for treatment or other patient management decisions. A negative result may occur with  improper specimen collection/handling, submission of specimen other than nasopharyngeal swab, presence of viral mutation(s) within the areas targeted by this assay, and inadequate number of viral copies(<138 copies/mL). A negative result must be combined with clinical observations, patient history, and epidemiological information. The  expected result is Negative.  Fact Sheet for Patients:  BloggerCourse.com  Fact Sheet for Healthcare Providers:   SeriousBroker.it  This test is no                          t yet approved or cleared by the [place] FDA and  has been authorized for detection and/or diagnosis of SARS-CoV-2 by FDA under an Emergency Use Authorization (EUA). This EUA will remain  in effect (meaning this test can be used) for the duration of the COVID-19 declaration under Section 564(b)(1) of the Act, 21 U.S.C.section 360bbb-3(b)(1), unless the authorization is terminated  or revoked sooner.       Influenza A by PCR 02/27/2022 NEGATIVE  NEGATIVE Final   Influenza B by PCR 02/27/2022 NEGATIVE  NEGATIVE Final   Comment: (NOTE) The Xpert Xpress SARS-CoV-2/FLU/RSV plus assay is intended as an aid in the diagnosis of influenza from Nasopharyngeal swab specimens and should not be used as a sole basis for treatment. Nasal washings and aspirates are unacceptable for Xpert Xpress SARS-CoV-2/FLU/RSV testing.  Fact Sheet for Patients: BloggerCourse.com  Fact Sheet for Healthcare Providers: SeriousBroker.it  This test is not yet approved or cleared by the [place] FDA and has been authorized for detection and/or diagnosis of SARS-CoV-2 by FDA under an Emergency Use Authorization (EUA). This EUA will remain in effect (meaning this test can be used) for the duration of the COVID-19 declaration under Section 564(b)(1) of the Act, 21 U.S.C. section 360bbb-3(b)(1), unless the authorization is terminated or revoked.  Performed at [facility], [address], [place], [place] [address]     Allergies: Patient has no known allergies.  PTA Medications: (Not in a hospital admission)   Long Term Goals: Improvement in symptoms so as ready for discharge  Short Term Goals: Patient will attend at least of 50% of the groups daily., Pt will complete the PHQ9 on admission, day 3 and discharge., and Patient will take medications as prescribed  daily.  Medical Decision Making  Patient transferred from OBS and admitted to the [facility] for mood stabilization, medication management, and psychotherapy for SI and worsening depression.   Medications:   Continue Lexapro 10 mg po daily for MDD. Consider increasing   Continue hydroxyzine 25 mg po TID prn for anxiety     Recommendations  Based on my evaluation the patient does not appear to have an emergency medical condition. Patient appears medically stable at this time based on vital signs, labs, physical exam and review of systems.   Ezma Rehm L, NP 07/23/22  8:25 AM
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
## 2022-07-23 NOTE — ED Notes (Signed)
Pt resting in bed. A&O x4, calm and cooperative. Denies current SI/HI/AVH. No signs of distress noted. Monitoring for safety. 

## 2022-07-23 NOTE — ED Notes (Addendum)
Error in charting.

## 2022-07-23 NOTE — ED Notes (Signed)
Pt in bedroom laying on bed alert. Denies concerns or needs at present. Informed pt to notify staff with any assistance. Will continue to monitor.

## 2022-07-23 NOTE — ED Notes (Signed)
Pt asleep in bed. Respirations even and unlabored. Monitoring for safety. 

## 2022-07-23 NOTE — ED Notes (Signed)
Pt sitting quietly, Aox4.  Denies pain, SI, HI, and AVH.  Reports he slept well overnight. Breathing is even and unlabored.  Will continue to monitor for safety.
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
## 2022-07-23 NOTE — ED Provider Notes (Incomplete)
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 Xpert Xpress SARS-CoV-2/FLU/RSV plus assay is intended as an aid in the diagnosis of influenza from Nasopharyngeal swab specimens and should not be used as a sole basis for treatment. Nasal washings and aspirates are unacceptable for Xpert Xpress SARS-CoV-2/FLU/RSV testing.  Fact Sheet for  Patients: BloggerCourse.com  Fact Sheet for Healthcare Providers: SeriousBroker.it  This test is not yet approved or cleared by the [place] FDA and has been authorized for detection and/or diagnosis of SARS-CoV-2 by FDA under an Emergency Use Authorization (EUA). This EUA will remain in effect (meaning this test can be used) for the duration of the COVID-19 declaration under Section 564(b)(1) of the Act, 21 U.S.C. section 360bbb-3(b)(1), unless the authorization is terminated or revoked.  Performed at [facility], [address] [place], [place] [address]     Blood Alcohol level:  Lab Results  Component Value Date   [facility] <10 02/28/2022   ETH <11 10/07/2014    Metabolic Disorder Labs: Lab Results  Component Value Date   HGBA1C 5.5 07/23/2022   MPG 111.15 07/23/2022   No results found for: "PROLACTIN" Lab Results  Component Value Date   CHOL 138 07/23/2022   TRIG 52 07/23/2022   HDL 60 07/23/2022   CHOLHDL 2.3 07/23/2022   VLDL 10 07/23/2022   LDLCALC 68 07/23/2022    Therapeutic Lab Levels: No results found for: "LITHIUM" No results found for: "VALPROATE" No results found for: "CBMZ"  Physical Findings   PHQ2-9    Flowsheet Row ED from 07/23/2022 in [facility]  PHQ-2 Total Score 0  PHQ-9 Total Score 2      Flowsheet Row ED from 07/23/2022 in [facility] ED from 03/01/2022 in [facility] EMERGENCY DEPART[facility] ED from 02/28/2022 in [facility]  C-SSRS RISK CATEGORY No Risk No Risk No Risk        Musculoskeletal  Strength & Muscle Tone: within normal limits Gait & Station: normal Patient leans: N/A  Psychiatric Specialty Exam  Presentation  General Appearance: Casual; Fairly Groomed  Eye Contact:Fair  Speech:Clear and Coherent; Normal Rate  Speech  Volume:Decreased  Handedness:Right   Mood and Affect  Mood:Euthymic ("good")  Affect:Congruent; Appropriate; Constricted   Thought Process  Thought Processes:Goal Directed; Coherent; Linear  Descriptions of Associations:Intact  Orientation:Full (Time, Place and Person)  Thought Content:WDL; Logical     Hallucinations:Hallucinations: None  Ideas of Reference:None  Suicidal Thoughts:Suicidal Thoughts: No  Homicidal Thoughts:Homicidal Thoughts: No   Sensorium  Memory:Immediate Good  Judgment:Fair  Insight:Fair   Executive Functions  Concentration:Good  Attention Span:Good  Recall:Good  Fund of Knowledge:Good  Language:Good   Psychomotor Activity  Psychomotor Activity:Psychomotor Activity: Normal   Assets  Assets:Communication Skills; Desire for Improvement; Housing; Physical Health   Sleep  Sleep:Sleep: Good  Physical Exam   Blood pressure (!) 140/67, pulse 65, temperature 98 F (36.7 C), temperature source Tympanic, resp. rate 18, SpO2 99 %. There is no height or weight on file to calculate BMI.  Physical Exam Vitals and nursing note reviewed.  Constitutional:      General: He is not in acute distress.    Appearance: He is not ill-appearing  or diaphoretic.  HENT:     Head: Normocephalic.  Pulmonary:     Effort: Pulmonary effort is normal. No respiratory distress.  Neurological:     General: No focal deficit present.     Mental Status: He is alert.     Treatment Plan Summary: Daily contact with patient to assess and evaluate symptoms and progress in treatment and Medication management  Uriyah Kearney Hardsaiah Koral is a 21 y.o. male with PMH of MDD, AUD, PTSD, NSSIB (last time years ago) past suicide attempts (last asphyxiation June 2023 in jail), who [facility] after "Mom called the police because I was feeling depressed and suicidal", then [facility] of his symptoms. Patient is currently on  probation.  Restarted lexapro 10 mg daily, increased to 20 mg, tolerated it well. Started Naltrexone for AUD.  He continues to deny SI, HI and, AVH and reports improvement in his depression and anxiety. Tolerating current lexapro. He is currently on CIWA with PRN's for alcohol withdrawal symptoms. His last CIWA was 0 and he did not require any PRN ativan.  Although patient is ready for discharge but safety planning could not be done today as mom was unavailable. Pt is high risk as he tried to hurt himself in prison and was on suicide watch so will wait until safety planning can be done with mom.   Labs reviewed-UDS +ve for marijuana, covid (-) influenza(-), CBC wnl except Hb 12.3, CMP - wnl except K 3.4 (repleted), A1c- 5.5, Lipid wnl, TSH (wnl)  MDD NSSIB Patient endorsed NSSIB that led to admission.  Continued home lexapro 20 mg daily Recommend outpatient psychiatry follow-up Discussed with CSW about PHP/IOP or CD-IOP/S-IOP  AUD Continue CIWA with Ativan as needed for CIWA greater than 10 Continue thiamine 100 mg daily. IM first day then oral. Continue multivitamin with minerals daily. Continue Zofran 4 mg every 6 hours as needed for nausea or vomiting. Continue Imodium 2 to 4 mg as needed for diarrhea or loose stools for 72 hours Started naltrexone 50mg  daily   Disposition:  Although patient is ready for discharge but safety planning could not be done today as mom was unavailable. Pt is high risk as he tried to hurt himself in prison and was on suicide watch so will wait until safety planning can be done with mom.  TOC consult to assist with reaching out to mom for safety planning  Total Time spent with patient: 20 minutes  Princess BruinsJulie Kama Cammarano, DO 07/26/2022 1:25 PM

## 2022-07-24 DIAGNOSIS — Z20822 Contact with and (suspected) exposure to covid-19: Secondary | ICD-10-CM | POA: Diagnosis not present

## 2022-07-24 DIAGNOSIS — F331 Major depressive disorder, recurrent, moderate: Secondary | ICD-10-CM | POA: Diagnosis not present

## 2022-07-24 DIAGNOSIS — F4310 Post-traumatic stress disorder, unspecified: Secondary | ICD-10-CM | POA: Diagnosis not present

## 2022-07-24 MED ORDER — THIAMINE MONONITRATE 100 MG PO TABS
100.0000 mg | ORAL_TABLET | Freq: Every day | ORAL | Status: DC
Start: 2022-07-25 — End: 2022-07-25
  Administered 2022-07-25: 100 mg via ORAL
  Filled 2022-07-24: qty 1

## 2022-07-24 MED ORDER — ADULT MULTIVITAMIN W/MINERALS CH
1.0000 | ORAL_TABLET | Freq: Every day | ORAL | Status: DC
  Administered 2022-07-24 – 2022-07-27 (×4): 1 via ORAL
  Filled 2022-07-24 (×4): qty 1

## 2022-07-24 MED ORDER — ESCITALOPRAM OXALATE 10 MG PO TABS
20.0000 mg | ORAL_TABLET | Freq: Every day | ORAL | Status: DC
  Administered 2022-07-25 – 2022-07-27 (×3): 20 mg via ORAL
  Filled 2022-07-24 (×3): qty 2

## 2022-07-24 MED ORDER — POTASSIUM CHLORIDE CRYS ER 20 MEQ PO TBCR
40.0000 meq | EXTENDED_RELEASE_TABLET | Freq: Once | ORAL | Status: AC
Start: 2022-07-24 — End: 2022-07-24
  Administered 2022-07-24: 40 meq via ORAL
  Filled 2022-07-24: qty 2

## 2022-07-24 MED ORDER — LORAZEPAM 1 MG PO TABS: 1.0000 mg | ORAL_TABLET | Freq: Four times a day (QID) | ORAL | Status: DC | PRN

## 2022-07-24 MED ORDER — THIAMINE HCL 100 MG/ML IJ SOLN
100.0000 mg | Freq: Once | INTRAMUSCULAR | Status: AC
  Administered 2022-07-24: 100 mg via INTRAMUSCULAR
  Filled 2022-07-24: qty 2

## 2022-07-24 MED ORDER — ONDANSETRON 4 MG PO TBDP: 4.0000 mg | ORAL_TABLET | Freq: Four times a day (QID) | ORAL | Status: DC | PRN

## 2022-07-24 MED ORDER — POTASSIUM CHLORIDE 20 MEQ PO PACK: 40.0000 meq | PACK | Freq: Once | ORAL | Status: DC

## 2022-07-24 MED ORDER — LOPERAMIDE HCL 2 MG PO CAPS: 2.0000 mg | ORAL_CAPSULE | ORAL | Status: DC | PRN

## 2022-07-24 NOTE — ED Notes (Signed)
Pt resting in bed. A&O x4, calm and cooperative. Denies current SI/HI/AVH. No signs of distress noted. Monitoring for safety. 

## 2022-07-24 NOTE — ED Notes (Signed)
Patient A&Ox4. Denies intent to harm self/others when asked. Denies A/VH. Patient denies any physical complaints when asked. No acute distress noted. Received am medication without difficulty. Pt states, "I slept great last night". Routine safety checks conducted according to facility protocol. Encouraged patient to notify staff if thoughts of harm toward self or others arise. Patient verbalize understanding and agreement. Will continue to monitor for safety.

## 2022-07-24 NOTE — ED Provider Notes (Signed)
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 [address] [place], [place] [address]     Blood Alcohol level:  Lab Results  Component Value Date   [facility] 02/28/2022   ETH <11 10/07/2014    Metabolic Disorder Labs: Lab Results  Component Value Date   HGBA1C 5.5 07/23/2022   MPG 111.15 07/23/2022   No results found for: "PROLACTIN" Lab Results   Component Value Date   CHOL 138 07/23/2022   TRIG 52 07/23/2022   HDL 60 07/23/2022   CHOLHDL 2.3 07/23/2022   VLDL 10 07/23/2022   LDLCALC 68 07/23/2022    Therapeutic Lab Levels: No results found for: "LITHIUM" No results found for: "VALPROATE" No results found for: "CBMZ"  Physical Findings   PHQ2-9    [facility]  PHQ-2 Total Score 0  PHQ-9 Total Score 2      [facility] from 02/28/2022 in [facility]  C-SSRS RISK CATEGORY No Risk No Risk No Risk        Musculoskeletal  Strength & Muscle Tone: within normal limits Gait & Station: normal Patient leans: N/A  Psychiatric Specialty Exam  Presentation  General Appearance: Appropriate for Environment  Eye Contact:Good  Speech:Clear and Coherent; Normal Rate  Speech Volume:Normal  Handedness:Right   Mood and Affect  Mood:Euthymic  Affect:Full Range   Thought Process  Thought Processes:Coherent  Descriptions of Associations:Intact  Orientation:Full (Time, Place and Person)  Thought Content:Logical     Hallucinations:Hallucinations: None  Ideas of Reference:None  Suicidal Thoughts:Suicidal Thoughts: No SI Passive Intent and/or Plan: Without Intent; Without Plan  Homicidal Thoughts:Homicidal Thoughts: No   Sensorium  Memory:Immediate Good; Recent Good  Judgment:Fair  Insight:Fair   Executive Functions  Concentration:Good  Attention Span:Good  Recall:Good  Fund of Knowledge:Good  Language:Good   Psychomotor Activity  Psychomotor Activity:Psychomotor Activity: Normal   Assets  Assets:Communication Skills; Desire for Improvement; Housing; Leisure Time; Physical Health; Social Support   Sleep  Sleep:Sleep: Good   Nutritional Assessment (For  OBS and [facility] admissions only) Has the patient had a weight loss or gain of 10 pounds or more in the last 3 months?: No Has the patient had a decrease in food intake/or appetite?: No Does the patient have dental problems?: No Does the patient have eating habits or behaviors that may be indicators of an eating disorder including binging or inducing vomiting?: No Has the patient recently lost weight without trying?: 0 Has the patient been eating poorly because of a decreased appetite?: 0 Malnutrition Screening Tool Score: 0    Physical Exam  Physical Exam Vitals and nursing note reviewed.  Constitutional:      General: He is not in acute distress.    Appearance: Normal appearance. He is not ill-appearing, toxic-appearing or diaphoretic.  HENT:     Head: Normocephalic and atraumatic.  Pulmonary:     Effort: Pulmonary effort is normal.  Neurological:     Mental Status: He is alert and oriented to person, place, and time.    Review of Systems  Constitutional:  Negative for chills and fever.  Respiratory:  Negative for cough and shortness  of breath.   Cardiovascular:  Negative for chest pain.  Gastrointestinal:  Negative for abdominal pain, diarrhea, nausea and vomiting.  Neurological:  Negative for dizziness and headaches.  Psychiatric/Behavioral:  Negative for hallucinations and suicidal ideas. The patient does not have insomnia.    Blood pressure (!) 133/57, pulse 69, temperature 98.5 F (36.9 C), temperature source Tympanic, resp. rate 18, SpO2 99 %. There is no height or weight on file to calculate BMI.  Treatment Plan Summary:Arul Jamieson Hetland is a 21 y.o. male with Ppx of depression, PTSD and  PMH of Asthma , who presented to [facility] via [facility] voluntary after "Mom called the police because I was feeling depressed and suicidal", then admitted to [facility] for stabilization of his symptoms.  Patient is currently on probation.   He currently denies SI, HI and, AVH and reports  improvement in his depression and anxiety.   Will increase lexapro to help with depression. Patient reports drinking alcohol daily and using marijuana. Pt last drank alcohol on Thursday. Will order CIWA with PRN's for alcohol withdrawal symptoms.  Labs reviewed-UDS +ve for marijuana, covid (-) influenza(-), CBC wnl except Hb 12.3, CMP - wnl except K 3.4, A1c- 5.5, Lipid wnl, TSH (wnl)   Daily contact with patient to assess and evaluate symptoms and progress in treatment  MDD, recurrent -Increase Lexapro to 20 mg daily for depression and anxiety.  Alcohol use disorder -Start CIWA with Ativan as needed for CIWA greater than 10 -Start thiamine 100 mg daily. IM first day then oral. -Start  multivitamin with minerals daily. -Start  Zofran 4 mg every 6 hours as needed for nausea or vomiting. -Start Imodium 2 to 4 mg as needed for diarrhea or loose stools for 72 hours.   Hypokalemia (K 3.4) - KLOR 40 mEq once  PRN's  -Continue Tylenol 650 mg every 6 hours as needed for pain or fever -Continue Milk of Magnesia 30 ml PRN Daily for Constipation. -Continue Maalox/Mylanta 30 ml Q4H PRN for Indigestion. -Continue Hydroxyzine 25 mg TID PRN for Anxiety.   Karsten Ro, MD PGY3 07/24/2022 11:37 AM

## 2022-07-24 NOTE — ED Notes (Signed)
Pt asleep in bed. Respirations even and unlabored. Monitoring for safety. 

## 2022-07-24 NOTE — ED Notes (Signed)
Pt sleeping in no acute distress. RR even and unlabored. Safety maintained. 

## 2022-07-25 DIAGNOSIS — F4310 Post-traumatic stress disorder, unspecified: Secondary | ICD-10-CM | POA: Diagnosis not present

## 2022-07-25 DIAGNOSIS — F331 Major depressive disorder, recurrent, moderate: Secondary | ICD-10-CM | POA: Diagnosis not present

## 2022-07-25 DIAGNOSIS — Z20822 Contact with and (suspected) exposure to covid-19: Secondary | ICD-10-CM | POA: Diagnosis not present

## 2022-07-25 MED ORDER — THIAMINE MONONITRATE 100 MG PO TABS
100.0000 mg | ORAL_TABLET | Freq: Every day | ORAL | Status: DC
  Administered 2022-07-26 – 2022-07-27 (×2): 100 mg via ORAL
  Filled 2022-07-25 (×2): qty 1

## 2022-07-25 MED ORDER — ESCITALOPRAM OXALATE 20 MG PO TABS
20.0000 mg | ORAL_TABLET | Freq: Every day | ORAL | 0 refills | Status: DC
Start: 2022-07-26 — End: 2022-07-26

## 2022-07-25 MED ORDER — HYDROXYZINE HCL 25 MG PO TABS
25.0000 mg | ORAL_TABLET | Freq: Three times a day (TID) | ORAL | 0 refills | Status: DC | PRN
Start: 2022-07-25 — End: 2022-07-26

## 2022-07-25 NOTE — ED Notes (Signed)
Pt asleep in bed. Respirations even and unlabored. Monitoring for safety. 

## 2022-07-25 NOTE — ED Notes (Signed)
Pt eating lunch and interacting with peers. No acute distress noted. Pt denies concerns. Will continue to monitor for safety.

## 2022-07-25 NOTE — ED Provider Notes (Signed)
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 07/23/2022   HDL 60 07/23/2022   CHOLHDL 2.3 07/23/2022   VLDL 10 07/23/2022   LDLCALC 68 07/23/2022    Therapeutic Lab Levels: No results found for: "LITHIUM" No results found for: "VALPROATE" No results found for: "CBMZ"  Physical Findings   PHQ2-9    [place] [facility] from 07/23/2022 in [facility]  PHQ-2 Total  Score 0  PHQ-9 Total Score 2      Flowsheet Row ED from 07/23/2022 in [facility] ED from 03/01/2022 in [place] ED from 02/28/2022 in [place] DEPT  C-SSRS RISK CATEGORY No Risk No Risk No Risk        Musculoskeletal  Strength & Muscle Tone: within normal limits Gait & Station: normal Patient leans: N/A  Psychiatric Specialty Exam  Presentation  General Appearance: Appropriate for Environment  Eye Contact:Good  Speech:Clear and Coherent; Normal Rate  Speech Volume:Normal  Handedness:Right   Mood and Affect  Mood:Euthymic  Affect:Full Range   Thought Process  Thought Processes:Coherent  Descriptions of Associations:Intact  Orientation:Full (Time, Place and Person)  Thought Content:Logical     Hallucinations:Hallucinations: None  Ideas of Reference:None  Suicidal Thoughts:Suicidal Thoughts: No  Homicidal Thoughts:Homicidal Thoughts: No   Sensorium  Memory:Immediate Good; Recent Good  Judgment:Fair  Insight:Fair   Executive Functions  Concentration:Good  Attention Span:Good  [place] of Knowledge:Good  Language:Good   Psychomotor Activity  Psychomotor Activity:Psychomotor Activity: Normal   Assets  Assets:Communication Skills; Desire for Improvement; Housing; Leisure Time; Physical Health; Social Support   Sleep  Sleep:Sleep: Good   No data recorded   Physical Exam  Physical Exam Vitals and nursing note reviewed.  Constitutional:      General: He is not in acute distress.    Appearance: Normal appearance. He is not ill-appearing, toxic-appearing or diaphoretic.  HENT:     Head: Normocephalic and atraumatic.  Pulmonary:     Effort: Pulmonary effort is normal.  Neurological:     Mental Status: He is alert and oriented to person, place, and time.    Review of Systems  Constitutional:  Negative for chills and fever.   Respiratory:  Negative for cough and shortness of breath.   Cardiovascular:  Negative for chest pain.  Gastrointestinal:  Negative for abdominal pain, diarrhea, nausea and vomiting.  Neurological:  Negative for dizziness and headaches.  Psychiatric/Behavioral:  Negative for hallucinations and suicidal ideas. The patient does not have insomnia.    Blood pressure (!) 121/42, pulse 64, temperature 98.5 F (36.9 C), temperature source Oral, resp. rate 18, SpO2 100 %. There is no height or weight on file to calculate BMI.  Treatment Plan Summary:Victor Stanley is a 21 y.o. male with Ppx of depression, PTSD and  PMH of Asthma , who presented to [facility] via [facility] voluntary after "Mom called the police because I was feeling depressed and suicidal", then admitted to [facility] for stabilization of his symptoms.  Patient is currently on probation.   He currently denies SI, HI and, AVH and reports improvement in his depression and anxiety.  Lexapro was increased yesterday.  He is currently on CIWA with PRN's for alcohol withdrawal symptoms.  His last CIWA was 0 and he  did not require any PRN ativan. Although patient is ready for discharge but safety planning could not be done today as mom was unavailable. Pt is high risk as he tried to hurt himself in prison and was on suicide watch so will wait until safety planning can be done with mom.   Labs reviewed-UDS +ve for marijuana, covid (-) influenza(-), CBC wnl except Hb 12.3, CMP - wnl except K 3.4 (repleted), A1c- 5.5, Lipid wnl, TSH (wnl)   Daily contact with patient to assess and evaluate symptoms and progress in treatment  MDD, recurrent -Continue Lexapro to 20 mg daily for depression and anxiety.  Alcohol use disorder -Continue CIWA with Ativan as needed for CIWA greater than 10 -Continue thiamine 100 mg daily. IM first day then oral. -Continue multivitamin with minerals daily. -Continue Zofran 4 mg every 6 hours as needed for nausea or  vomiting. -Continue Imodium 2 to 4 mg as needed for diarrhea or loose stools for 72 hours.   Hypokalemia (K 3.4) Repleted  PRN's  -Continue Tylenol 650 mg every 6 hours as needed for pain or fever -Continue Milk of Magnesia 30 ml PRN Daily for Constipation. -Continue Maalox/Mylanta 30 ml Q4H PRN for Indigestion. -Continue Hydroxyzine 25 mg TID PRN for Anxiety.  Disposition:  Although patient is ready for discharge but safety planning could not be done today as mom was unavailable. Pt is high risk as he tried to hurt himself in prison and was on suicide watch so will wait until safety planning can be done with mom.   Armando Reichert, MD PGY3 07/25/2022 2:21 PM

## 2022-07-25 NOTE — ED Notes (Signed)
Pt sitting in dining room watching TV. A&O x4, calm and cooperative. Denies current SI/HI/AVH. No signs of distress noted. Monitoring for safety.  

## 2022-07-25 NOTE — ED Notes (Signed)
Patient A&Ox4. Denies intent to harm self/others when asked. Denies A/VH. Patient denies any physical complaints when asked. No acute distress noted. Pt states, "I feel great today. I slept really good last night". Praise given. Routine safety checks conducted according to facility protocol. Encouraged patient to notify staff if thoughts of harm toward self or others arise. Patient verbalize understanding and agreement. Will continue to monitor for safety.

## 2022-07-26 ENCOUNTER — Encounter (HOSPITAL_COMMUNITY): Payer: Self-pay | Admitting: Behavioral Health

## 2022-07-26 DIAGNOSIS — F4310 Post-traumatic stress disorder, unspecified: Secondary | ICD-10-CM | POA: Diagnosis not present

## 2022-07-26 DIAGNOSIS — R45851 Suicidal ideations: Secondary | ICD-10-CM

## 2022-07-26 DIAGNOSIS — F1090 Alcohol use, unspecified, uncomplicated: Secondary | ICD-10-CM | POA: Diagnosis present

## 2022-07-26 DIAGNOSIS — F331 Major depressive disorder, recurrent, moderate: Secondary | ICD-10-CM | POA: Diagnosis not present

## 2022-07-26 DIAGNOSIS — Z9151 Personal history of suicidal behavior: Secondary | ICD-10-CM

## 2022-07-26 DIAGNOSIS — Z20822 Contact with and (suspected) exposure to covid-19: Secondary | ICD-10-CM | POA: Diagnosis not present

## 2022-07-26 DIAGNOSIS — Z9152 Personal history of nonsuicidal self-harm: Secondary | ICD-10-CM

## 2022-07-26 MED ORDER — NALTREXONE HCL 50 MG PO TABS: 50.0000 mg | ORAL_TABLET | Freq: Every day | ORAL | 0 refills | Status: DC

## 2022-07-26 MED ORDER — HYDROXYZINE HCL 25 MG PO TABS: 25.0000 mg | ORAL_TABLET | Freq: Three times a day (TID) | ORAL | 0 refills | Status: AC | PRN

## 2022-07-26 MED ORDER — NALTREXONE HCL 50 MG PO TABS
50.0000 mg | ORAL_TABLET | Freq: Every day | ORAL | Status: DC
  Administered 2022-07-27: 50 mg via ORAL
  Filled 2022-07-26: qty 1

## 2022-07-26 MED ORDER — NALTREXONE HCL 50 MG PO TABS
50.0000 mg | ORAL_TABLET | Freq: Once | ORAL | Status: AC
  Administered 2022-07-26: 50 mg via ORAL
  Filled 2022-07-26: qty 1

## 2022-07-26 MED ORDER — ESCITALOPRAM OXALATE 20 MG PO TABS: 20.0000 mg | ORAL_TABLET | Freq: Every day | ORAL | 0 refills | Status: DC

## 2022-07-26 NOTE — ED Notes (Signed)
Patient attending AA meeting and appears attentive. 

## 2022-07-26 NOTE — ED Notes (Signed)
Pt asleep in bed. Respirations even and unlabored. Monitoring for safety. 

## 2022-07-26 NOTE — Discharge Instructions (Addendum)
Dear Lenon Curt,  Most effective treatment for your mental health disease involves BOTH a psychiatrist AND a therapist Psychiatrist to manage medications Therapist to help identify personal goals, barriers from those goals, and plan to achieve those goals by understanding emotions Please make regular appointments with an outpatient psychiatrist and other doctors once you leave the hospital (if any, otherwise, please see below for resources to make an appointment).  For therapy outside the hospital, please ask for these specific types of therapy: DBT ________________________________________________________  SAFETY  Dial 988 for National Suicide & Crisis Lifeline    Text [phone] for Crisis Text Line:     [facility] URGENT CARE:  [address] FIRST FLOOR.  [place], [place] [address].  [phone]  Mobile Crisis Response Teams Listed by counties in vicinity of [facility] providers [facility] [phone] [facility] [phone] [facility] [phone] [facility] [phone] [place]                * [facility] [phone]                * [facility] [phone] [facility] [phone] [facility]  [phone] * [facility] [phone] ________________________________________________________  To see which pharmacy near you is the CHEAPEST for certain medications, please use GoodRx. It is free website and has a free phone app.    Also consider looking at [facility] $4.00 or [facility]'s $7.00 prescription list. Both are free to view if googled "[facility] $4 prescription" and "public's $7 prescription". These are set prices, no insurance required. [facility] low cost medications: $4-$15 for 30days prescriptions or  $10-$38 for 90days prescriptions  ________________________________________________________  For issues with sleep, please use this free app for insomnia called CBT-I. Let your doctors and therapists know so they can help with extra tips and tricks or for guidance and accountability. NO ADDS on the app.     ________________________________________________________  [facility] [address] SECOND FLOOR [place], [place] [address] [phone] OUTPATIENT Walk-in information: Please note, all walk-ins are first come & first serve, with limited number of availability.  Please note that to be eligible for services you must bring: ID or a piece of mail with your name [facility] address  Therapist for therapy:  Monday & Wednesdays: Please ARRIVE at 7:15 AM for registration Will START at 8:00 AM Every 1st & 2nd Friday of the month: Please ARRIVE at 10:15 AM for registration Will START at 1 PM - 5 PM  Psychiatrist for medication management: Monday - Friday:  Please ARRIVE at 7:15 AM for registration Will START at 8:00 AM  Regretfully, due to limited availability, please be aware that you may not been seen on the same day as walk-in. Please consider making an appoint or try again. Thank you for your patience and understanding.

## 2022-07-26 NOTE — BHH Group Notes (Addendum)
LCSW Wellness Group Note   07/26/2022 1:00pm  Type of Group and Topic: Psychoeducational Group:  Wellness  Participation Level:  Active  Description of Group  Wellness group introduces the topic and its focus on developing healthy habits across the spectrum and its relationship to a decrease in hospital admissions.  Six areas of wellness are discussed: physical, social spiritual, intellectual, occupational, and emotional.  Patients are asked to consider their current wellness habits and to identify areas of wellness where they are interested and able to focus on improvements.    Therapeutic Goals Patients will understand components of wellness and how they can positively impact overall health.  Patients will identify areas of wellness where they have developed good habits. Patients will identify areas of wellness where they would like to make improvements.    Summary of Patient Progress: pt active participant in group discussion.  Pt identified intellectual and physical as wellness areas of strength and identified emotional as a wellness area that needs improvement.  Good overall participation.      Therapeutic Modalities: Cognitive Behavioral Therapy Psychoeducation    Lorri Frederick, LCSW
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
## 2022-07-26 NOTE — Progress Notes (Signed)
Patient pleasant and cooperative with care.  Initiates appropriate interactions with his peers.   He was compliant with medication regimen as ordered. 

## 2022-07-26 NOTE — ED Notes (Signed)
Patient attended group with Child psychotherapist.

## 2022-07-26 NOTE — ED Notes (Signed)
Patient asleep in bed at this time.  No distress or complaint.

## 2022-07-26 NOTE — Progress Notes (Signed)
Patient attending social work group.   

## 2022-07-27 DIAGNOSIS — F4310 Post-traumatic stress disorder, unspecified: Secondary | ICD-10-CM | POA: Diagnosis not present

## 2022-07-27 DIAGNOSIS — Z20822 Contact with and (suspected) exposure to covid-19: Secondary | ICD-10-CM | POA: Diagnosis not present

## 2022-07-27 DIAGNOSIS — F331 Major depressive disorder, recurrent, moderate: Secondary | ICD-10-CM | POA: Diagnosis not present
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
## 2022-07-27 NOTE — Progress Notes (Signed)
Pt in his bed, breathing even and unlabored.  Patient has not been up and asked for any PRN medications thus far this shift. 

## 2022-07-27 NOTE — ED Notes (Signed)
Patient was awake earlier and attended to ADL's and ate breakfast.  He is calm and pleasant on approach.  Patient pending for discharge later today.  Will monitor and provide support as needed.

## 2022-07-27 NOTE — Progress Notes (Signed)
New pt appt scheduled with [facility] for 9/28 at 230pm with Dr Laural Benes. Intake appt for SAIOP scheduled for 9/6 at 2:00pm with Tiarra. Daleen Squibb, MSW, LCSW 9/5/202310:24 AM

## 2022-07-28 ENCOUNTER — Ambulatory Visit (HOSPITAL_COMMUNITY): Payer: Medicaid Other | Admitting: Licensed Clinical Social Worker

## 2022-08-19 ENCOUNTER — Ambulatory Visit: Payer: Medicaid Other | Attending: Internal Medicine | Admitting: Internal Medicine

## 2023-01-20 ENCOUNTER — Telehealth (HOSPITAL_COMMUNITY): Payer: Self-pay | Admitting: *Deleted

## 2023-01-20 NOTE — Telephone Encounter (Signed)
Pharmacy request for patients Lexapro, he has not been seen by this clinic since Sept 23 and due to the length of time and having no future appt scheduled. Will not forward this request for meds at this time.

## 2023-02-07 ENCOUNTER — Emergency Department (HOSPITAL_COMMUNITY): Payer: Medicaid Other

## 2023-02-07 ENCOUNTER — Emergency Department (HOSPITAL_COMMUNITY)
Admission: EM | Admit: 2023-02-07 | Discharge: 2023-02-07 | Discharge status: Against Medical Advice | Payer: Medicaid Other | Attending: Emergency Medicine | Admitting: Emergency Medicine

## 2023-02-07 ENCOUNTER — Encounter (HOSPITAL_COMMUNITY): Payer: Self-pay | Admitting: *Deleted

## 2023-02-07 ENCOUNTER — Other Ambulatory Visit: Payer: Self-pay

## 2023-02-07 DIAGNOSIS — J45909 Unspecified asthma, uncomplicated: Secondary | ICD-10-CM | POA: Diagnosis not present

## 2023-02-07 DIAGNOSIS — R112 Nausea with vomiting, unspecified: Secondary | ICD-10-CM | POA: Insufficient documentation

## 2023-02-07 DIAGNOSIS — R1084 Generalized abdominal pain: Secondary | ICD-10-CM | POA: Diagnosis not present

## 2023-02-07 DIAGNOSIS — E860 Dehydration: Secondary | ICD-10-CM | POA: Diagnosis not present

## 2023-02-07 DIAGNOSIS — F1010 Alcohol abuse, uncomplicated: Secondary | ICD-10-CM | POA: Insufficient documentation

## 2023-02-07 LAB — CBC WITH DIFFERENTIAL/PLATELET
Abs Immature Granulocytes: 0.03 10*3/uL (ref 0.00–0.07)
Basophils Absolute: 0 10*3/uL (ref 0.0–0.1)
Basophils Relative: 1 %
Eosinophils Absolute: 0.1 10*3/uL (ref 0.0–0.5)
Eosinophils Relative: 1 %
HCT: 40.6 % (ref 39.0–52.0)
Hemoglobin: 13.8 g/dL (ref 13.0–17.0)
Immature Granulocytes: 0 %
Lymphocytes Relative: 20 %
Lymphs Abs: 1.6 10*3/uL (ref 0.7–4.0)
MCH: 28.7 pg (ref 26.0–34.0)
MCHC: 34 g/dL (ref 30.0–36.0)
MCV: 84.4 fL (ref 80.0–100.0)
Monocytes Absolute: 0.5 10*3/uL (ref 0.1–1.0)
Monocytes Relative: 7 %
Neutro Abs: 5.7 10*3/uL (ref 1.7–7.7)
Neutrophils Relative %: 71 %
Platelets: 409 10*3/uL — ABNORMAL HIGH (ref 150–400)
RBC: 4.81 MIL/uL (ref 4.22–5.81)
RDW: 14.2 % (ref 11.5–15.5)
WBC: 8 10*3/uL (ref 4.0–10.5)
nRBC: 0 % (ref 0.0–0.2)

## 2023-02-07 LAB — COMPREHENSIVE METABOLIC PANEL
ALT: 23 U/L (ref 0–44)
AST: 29 U/L (ref 15–41)
Albumin: 4.1 g/dL (ref 3.5–5.0)
Alkaline Phosphatase: 47 U/L (ref 38–126)
Anion gap: 8 (ref 5–15)
BUN: 11 mg/dL (ref 6–20)
CO2: 23 mmol/L (ref 22–32)
Calcium: 8.5 mg/dL — ABNORMAL LOW (ref 8.9–10.3)
Chloride: 105 mmol/L (ref 98–111)
Creatinine, Ser: 0.88 mg/dL (ref 0.61–1.24)
GFR, Estimated: >60 mL/min (ref >60)
Glucose, Bld: 125 mg/dL — ABNORMAL HIGH (ref 70–99)
Potassium: 3.6 mmol/L (ref 3.5–5.1)
Sodium: 136 mmol/L (ref 135–145)
Total Bilirubin: 0.9 mg/dL (ref 0.3–1.2)
Total Protein: 6.9 g/dL (ref 6.5–8.1)

## 2023-02-07 LAB — LIPASE, BLOOD: Lipase: 42 U/L (ref 11–51)

## 2023-02-07 LAB — MAGNESIUM: Magnesium: 2 mg/dL (ref 1.7–2.4)

## 2023-02-07 MED ORDER — DROPERIDOL 2.5 MG/ML IJ SOLN
1.2500 mg | Freq: Once | INTRAMUSCULAR | Status: AC
  Administered 2023-02-07: 1.25 mg via INTRAVENOUS
  Filled 2023-02-07: qty 2

## 2023-02-07 MED ORDER — SODIUM CHLORIDE 0.9 % IV BOLUS
1000.0000 mL | Freq: Once | INTRAVENOUS | Status: AC
  Administered 2023-02-07: 1000 mL via INTRAVENOUS

## 2023-02-07 MED ORDER — ONDANSETRON HCL 4 MG/2ML IJ SOLN
4.0000 mg | Freq: Once | INTRAMUSCULAR | Status: AC
  Administered 2023-02-07: 4 mg via INTRAVENOUS
  Filled 2023-02-07: qty 2

## 2023-02-07 NOTE — ED Triage Notes (Signed)
Patient presents to ed via [facility] c/o n/v onset Sat pm, states its getting worse, stastes he has been drinking heavily the past several weeks.

## 2023-02-07 NOTE — ED Notes (Signed)
Pt awaiting CT scan.

## 2023-02-07 NOTE — ED Provider Notes (Signed)
[place] EMERGENCY DEPARTMENT AT [facility] Provider Note   CSN: [id number] Arrival date & time: 02/07/23  7829     History  Chief Complaint  Patient presents with   Emesis    Victor Stanley is a 22 y.o. male.  Pt is a 22 yo male with pmhx significant for asthma, depression, and ptsd.  Pt said he's been drinking heavily for the past few weeks.  He's had n/v and abd pain since Saturday, 3/16.  He's not been able to keep anything down.  Pain in abd is diffuse.  No fevers.         Home Medications Prior to Admission medications   Medication Sig Start Date End Date Taking? Authorizing Provider  escitalopram (LEXAPRO) 20 MG tablet Take 1 tablet (20 mg total) by mouth daily. 07/26/22   Princess Bruins, DO  naltrexone (DEPADE) 50 MG tablet Take 1 tablet (50 mg total) by mouth daily. 07/27/22 08/26/22  Princess Bruins, DO      Allergies    Patient has no known allergies.    Review of Systems   Review of Systems  Gastrointestinal:  Positive for abdominal pain, nausea and vomiting.  All other systems reviewed and are negative.   Physical Exam Updated Vital Signs BP (!) 140/78 (BP Location: Left Arm)   Pulse (!) 57   Temp 97.9 F (36.6 C) (Oral)   Resp (!) 22   Ht 5\' 11"  (1.803 m)   Wt 73.5 kg   SpO2 100%   BMI 22.59 kg/m  Physical Exam Vitals and nursing note reviewed.  Constitutional:      Appearance: Normal appearance. He is ill-appearing.  HENT:     Head: Normocephalic and atraumatic.     Right Ear: External ear normal.     Left Ear: External ear normal.     Nose: Nose normal.     Mouth/Throat:     Mouth: Mucous membranes are dry.  Eyes:     Extraocular Movements: Extraocular movements intact.     Conjunctiva/sclera: Conjunctivae normal.     Pupils: Pupils are equal, round, and reactive to light.  Cardiovascular:     Rate and Rhythm: Normal rate and regular rhythm.     Pulses: Normal pulses.     Heart sounds: Normal heart sounds.   Pulmonary:     Effort: Pulmonary effort is normal.     Breath sounds: Normal breath sounds.  Abdominal:     General: Abdomen is flat. Bowel sounds are normal.     Palpations: Abdomen is soft.     Tenderness: There is generalized abdominal tenderness.  Musculoskeletal:        General: Normal range of motion.     Cervical back: Normal range of motion and neck supple.  Skin:    General: Skin is warm.     Capillary Refill: Capillary refill takes less than 2 seconds.  Neurological:     General: No focal deficit present.     Mental Status: He is alert and oriented to person, place, and time.  Psychiatric:        Mood and Affect: Mood normal.        Behavior: Behavior normal.        Thought Content: Thought content normal.        Judgment: Judgment normal.     ED Results / Procedures / Treatments   Labs (all labs ordered are listed, but only abnormal results are displayed) Labs Reviewed  CBC WITH  DIFFERENTIAL/PLATELET - Abnormal; Notable for the following components:      Result Value   Platelets 409 (*)    All other components within normal limits  COMPREHENSIVE METABOLIC PANEL - Abnormal; Notable for the following components:   Glucose, Bld 125 (*)    Calcium 8.5 (*)    All other components within normal limits  LIPASE, BLOOD  MAGNESIUM    EKG None  Radiology No results found.  Procedures Procedures    Medications Ordered in ED Medications  sodium chloride 0.9 % bolus 1,000 mL (0 mLs Intravenous Stopped 02/07/23 0857)  ondansetron (ZOFRAN) injection 4 mg (4 mg Intravenous Given 02/07/23 0750)  droperidol (INAPSINE) 2.5 MG/ML injection 1.25 mg (1.25 mg Intravenous Given 02/07/23 0856)    ED Course/ Medical Decision Making/ A&P                             Medical Decision Making Amount and/or Complexity of Data Reviewed Labs: ordered.  Risk Prescription drug management.   This patient presents to the ED for concern of n/v, this involves an extensive number  of treatment options, and is a complaint that carries with it a high risk of complications and morbidity.  The differential diagnosis includes pancreatitis, cholelithiasis, appendicitis, gastritis, viral   Co morbidities that complicate the patient evaluation  Alcohol abuse, depression, ptsd, asthma   Additional history obtained:  Additional history obtained from epic chart review External records from outside source obtained and reviewed including EMS report   Lab Tests:  I Ordered, and personally interpreted labs.  The pertinent results include:  cbc nl, cmp nl, mg nl, lip nl   Imaging Studies ordered:  I ordered imaging studies including ct abd/pelvis  Pt left before CT was done   Cardiac Monitoring:  The patient was maintained on a cardiac monitor.  I personally viewed and interpreted the cardiac monitored which showed an underlying rhythm of: nsr   Medicines ordered and prescription drug management:  I ordered medication including IVFs, zofran, inapsine  for sx  Reevaluation of the patient after these medicines showed that the patient improved I have reviewed the patients home medicines and have made adjustments as needed   Test Considered:  ct   Critical Interventions:  ivfs  Problem List / ED Course:  N/v/dehydration:  pt given ivfs and zofran/inapsine.  CT ordered b/c of abd pain.  However, when I went to reassess him, he was gone.  Nurse said he told her he felt better and wanted to go.  He asked her to remove IV and he left.  I was not aware of this until I went to find him and did not get to talk to him prior to him leaving.   Social Determinants of Health:  Lives at home   Dispostion:  Pt eloped.        Final Clinical Impression(s) / ED Diagnoses Final diagnoses:  Dehydration  Nausea and vomiting, unspecified vomiting type  Generalized abdominal pain  Alcohol abuse    Rx / DC Orders ED Discharge Orders     None          Jacalyn Lefevre, MD 02/08/23 [phone]

## 2023-02-07 NOTE — ED Notes (Signed)
Report received assumed care at this time.

## 2023-02-08 ENCOUNTER — Emergency Department (HOSPITAL_COMMUNITY)
Admission: EM | Admit: 2023-02-08 | Discharge: 2023-02-08 | Disposition: A | Discharge status: Home / Self Care | Payer: Medicaid Other | Attending: Emergency Medicine | Admitting: Emergency Medicine

## 2023-02-08 ENCOUNTER — Other Ambulatory Visit: Payer: Self-pay

## 2023-02-08 ENCOUNTER — Encounter (HOSPITAL_COMMUNITY): Payer: Self-pay

## 2023-02-08 ENCOUNTER — Emergency Department (HOSPITAL_COMMUNITY): Payer: Medicaid Other

## 2023-02-08 DIAGNOSIS — R112 Nausea with vomiting, unspecified: Secondary | ICD-10-CM

## 2023-02-08 DIAGNOSIS — K2920 Alcoholic gastritis without bleeding: Secondary | ICD-10-CM

## 2023-02-08 DIAGNOSIS — R109 Unspecified abdominal pain: Secondary | ICD-10-CM | POA: Diagnosis present

## 2023-02-08 LAB — COMPREHENSIVE METABOLIC PANEL
ALT: 26 U/L (ref 0–44)
AST: 22 U/L (ref 15–41)
Albumin: 4.5 g/dL (ref 3.5–5.0)
Alkaline Phosphatase: 49 U/L (ref 38–126)
Anion gap: 10 (ref 5–15)
BUN: 8 mg/dL (ref 6–20)
CO2: 25 mmol/L (ref 22–32)
Calcium: 9.4 mg/dL (ref 8.9–10.3)
Chloride: 104 mmol/L (ref 98–111)
Creatinine, Ser: 0.98 mg/dL (ref 0.61–1.24)
GFR, Estimated: >60 mL/min (ref >60)
Glucose, Bld: 105 mg/dL — ABNORMAL HIGH (ref 70–99)
Potassium: 3.4 mmol/L — ABNORMAL LOW (ref 3.5–5.1)
Sodium: 139 mmol/L (ref 135–145)
Total Bilirubin: 1.1 mg/dL (ref 0.3–1.2)
Total Protein: 7.6 g/dL (ref 6.5–8.1)

## 2023-02-08 LAB — CBC WITH DIFFERENTIAL/PLATELET
Abs Immature Granulocytes: 0.02 10*3/uL (ref 0.00–0.07)
Basophils Absolute: 0 10*3/uL (ref 0.0–0.1)
Basophils Relative: 0 %
Eosinophils Absolute: 0 10*3/uL (ref 0.0–0.5)
Eosinophils Relative: 0 %
HCT: 39.1 % (ref 39.0–52.0)
Hemoglobin: 13.1 g/dL (ref 13.0–17.0)
Immature Granulocytes: 0 %
Lymphocytes Relative: 24 %
Lymphs Abs: 1.9 10*3/uL (ref 0.7–4.0)
MCH: 28.5 pg (ref 26.0–34.0)
MCHC: 33.5 g/dL (ref 30.0–36.0)
MCV: 85.2 fL (ref 80.0–100.0)
Monocytes Absolute: 0.8 10*3/uL (ref 0.1–1.0)
Monocytes Relative: 10 %
Neutro Abs: 5.3 10*3/uL (ref 1.7–7.7)
Neutrophils Relative %: 66 %
Platelets: 318 10*3/uL (ref 150–400)
RBC: 4.59 MIL/uL (ref 4.22–5.81)
RDW: 13.9 % (ref 11.5–15.5)
WBC: 8.1 10*3/uL (ref 4.0–10.5)
nRBC: 0 % (ref 0.0–0.2)

## 2023-02-08 MED ORDER — PANTOPRAZOLE SODIUM 40 MG IV SOLR
40.0000 mg | Freq: Once | INTRAVENOUS | Status: AC
  Administered 2023-02-08: 40 mg via INTRAVENOUS
  Filled 2023-02-08: qty 10

## 2023-02-08 MED ORDER — IOHEXOL 350 MG/ML SOLN
75.0000 mL | Freq: Once | INTRAVENOUS | Status: AC | PRN
  Administered 2023-02-08: 75 mL via INTRAVENOUS

## 2023-02-08 MED ORDER — SUCRALFATE 1 G PO TABS: 1.0000 g | ORAL_TABLET | Freq: Three times a day (TID) | ORAL | 0 refills | Status: DC

## 2023-02-08 MED ORDER — FAMOTIDINE 20 MG PO TABS: 20.0000 mg | ORAL_TABLET | Freq: Two times a day (BID) | ORAL | 0 refills | Status: AC

## 2023-02-08 MED ORDER — SODIUM CHLORIDE 0.9 % IV BOLUS
1000.0000 mL | Freq: Once | INTRAVENOUS | Status: AC
  Administered 2023-02-08: 1000 mL via INTRAVENOUS

## 2023-02-08 MED ORDER — METOCLOPRAMIDE HCL 10 MG PO TABS: 10.0000 mg | ORAL_TABLET | Freq: Four times a day (QID) | ORAL | 0 refills | Status: DC

## 2023-02-08 MED ORDER — METOCLOPRAMIDE HCL 5 MG/ML IJ SOLN
10.0000 mg | Freq: Once | INTRAMUSCULAR | Status: AC
  Administered 2023-02-08: 10 mg via INTRAVENOUS
  Filled 2023-02-08: qty 2

## 2023-02-08 MED ORDER — ONDANSETRON HCL 4 MG/2ML IJ SOLN
4.0000 mg | Freq: Once | INTRAMUSCULAR | Status: AC
  Administered 2023-02-08: 4 mg via INTRAVENOUS

## 2023-02-08 NOTE — Discharge Instructions (Addendum)
Please read and follow all provided instructions.  Your diagnoses today include:  1. Acute alcoholic gastritis without hemorrhage   2. Nausea and vomiting, unspecified vomiting type     Tests performed today include: Blood cell counts and platelets Kidney and liver function tests CT scan of the abdomen and pelvis: no serious problems seen Vital signs. See below for your results today.   Medications prescribed:  Pepcid (famotidine) - antihistamine  You can find this medication over-the-counter.   DO NOT exceed:  20mg  Pepcid every 12 hours  Carafate - for stomach upset and to protect your stomach  Take any prescribed medications only as directed.  Home care instructions:  Follow any educational materials contained in this packet.  Follow-up instructions: Please follow-up with your primary care provider in the next 3 days for further evaluation of your symptoms.    Return instructions:  SEEK IMMEDIATE MEDICAL ATTENTION IF: The pain does not go away or becomes severe  A temperature above 101F develops  Repeated vomiting occurs (multiple episodes)  The pain becomes localized to portions of the abdomen. The right side could possibly be appendicitis. In an adult, the left lower portion of the abdomen could be colitis or diverticulitis.  Blood is being passed in stools or vomit (bright red or black tarry stools)  You develop chest pain, difficulty breathing, dizziness or fainting, or become confused, poorly responsive, or inconsolable (young children) If you have any other emergent concerns regarding your health  Additional Information: Abdominal (belly) pain can be caused by many things. Your caregiver performed an examination and possibly ordered blood/urine tests and imaging (CT scan, x-rays, ultrasound). Many cases can be observed and treated at home after initial evaluation in the emergency department. Even though you are being discharged home, abdominal pain can be  unpredictable. Therefore, you need a repeated exam if your pain does not resolve, returns, or worsens. Most patients with abdominal pain don't have to be admitted to the hospital or have surgery, but serious problems like appendicitis and gallbladder attacks can start out as nonspecific pain. Many abdominal conditions cannot be diagnosed in one visit, so follow-up evaluations are very important.  Your vital signs today were: BP (!) 145/82   Pulse 75   Temp 98 F (36.7 C)   Resp 16   Ht 5\' 11"  (1.803 m)   Wt 73 kg   SpO2 98%   BMI 22.45 kg/m  If your blood pressure (bp) was elevated above 135/85 this visit, please have this repeated by your doctor within one month. --------------

## 2023-02-08 NOTE — ED Notes (Signed)
Patient vomited, MD Pecola Leisure notified.

## 2023-02-08 NOTE — ED Notes (Signed)
Shift report received, assumed care of patient at this time.  

## 2023-02-08 NOTE — ED Triage Notes (Signed)
Patient returns to the ED from home with c/o nausea and vomiting today. States he was seen Sunday for alcohol poisoning, and was still experiencing NV this morning.

## 2023-02-08 NOTE — ED Provider Notes (Signed)
[place] Provider Note   CSN: [id number] Arrival date & time: 02/08/23  1719     History {Add pertinent medical, surgical, social history, OB history to HPI:1} Chief Complaint  Patient presents with   Nausea   Emesis    Victor Stanley is a 22 y.o. male.  Patient presents to the emergency department for evaluation of abdominal pain and vomiting.  Symptoms have been ongoing for the past several days.  He was seen in the emergency department yesterday and had reassuring lab workup, but left before CT imaging was performed.  He returns today with persistent symptoms.  Last vomited early this morning.  No fevers.  Denies history of abdominal surgeries.  Denies heavy NSAID use.  He did drink a lot of alcohol recently, last use was 3 days ago.  Pain is in the upper abdomen.  No radiation.       Home Medications Prior to Admission medications   Medication Sig Start Date End Date Taking? Authorizing Provider  escitalopram (LEXAPRO) 20 MG tablet Take 1 tablet (20 mg total) by mouth daily. 07/26/22   Merrily Brittle, DO  naltrexone (DEPADE) 50 MG tablet Take 1 tablet (50 mg total) by mouth daily. 07/27/22 08/26/22  Merrily Brittle, DO      Allergies    Patient has no known allergies.    Review of Systems   Review of Systems  Physical Exam Updated Vital Signs BP (!) 145/82   Pulse 75   Temp 98 F (36.7 C)   Resp 16   Ht 5\' 11"  (1.803 m)   Wt 73 kg   SpO2 98%   BMI 22.45 kg/m  Physical Exam Vitals and nursing note reviewed.  Constitutional:      General: He is not in acute distress.    Appearance: He is well-developed.  HENT:     Head: Normocephalic and atraumatic.  Eyes:     General:        Right eye: No discharge.        Left eye: No discharge.     Conjunctiva/sclera: Conjunctivae normal.  Cardiovascular:     Rate and Rhythm: Normal rate and regular rhythm.     Heart sounds: Normal heart sounds.  Pulmonary:      Effort: Pulmonary effort is normal.     Breath sounds: Normal breath sounds.  Abdominal:     Palpations: Abdomen is soft.     Tenderness: There is abdominal tenderness. There is no guarding or rebound.     Comments: Mild epigastric  Musculoskeletal:     Cervical back: Normal range of motion and neck supple.  Skin:    General: Skin is warm and dry.  Neurological:     Mental Status: He is alert.     ED Results / Procedures / Treatments   Labs (all labs ordered are listed, but only abnormal results are displayed) Labs Reviewed  COMPREHENSIVE METABOLIC PANEL - Abnormal; Notable for the following components:      Result Value   Potassium 3.4 (*)    Glucose, Bld 105 (*)    All other components within normal limits  CBC WITH DIFFERENTIAL/PLATELET    EKG None  Radiology CT ABDOMEN PELVIS W CONTRAST  Result Date: 02/08/2023 CLINICAL DATA:  Nausea and vomiting today. Acute, nonlocalized abdominal pain. EXAM: CT ABDOMEN AND PELVIS WITH CONTRAST TECHNIQUE: Multidetector CT imaging of the abdomen and pelvis was performed using the standard protocol following bolus administration  of intravenous contrast. RADIATION DOSE REDUCTION: This exam was performed according to the departmental dose-optimization program which includes automated exposure control, adjustment of the mA and/or kV according to patient size and/or use of iterative reconstruction technique. CONTRAST:  63mL OMNIPAQUE IOHEXOL 350 MG/ML SOLN COMPARISON:  03/01/2022 FINDINGS: Lower chest:  No contributory findings. Hepatobiliary: No focal liver abnormality.No evidence of biliary obstruction or stone. Pancreas: Unremarkable. Spleen: Unremarkable. Adrenals/Urinary Tract: Negative adrenals. No hydronephrosis or stone. Unremarkable bladder. Stomach/Bowel: No obstruction. The appendix is not clearly seen, no secondary signs of appendicitis. No bowel inflammation seen throughout the abdomen. Vascular/Lymphatic: No acute vascular abnormality.  No mass or adenopathy. Reproductive:No pathologic findings. Other: No ascites or pneumoperitoneum. Musculoskeletal: No acute abnormalities. As noted previously, notable congenitally narrow. Lumbar spinal canal from short pedicles with mild L3-4 and L4-5 disc bulging superimposed. IMPRESSION: No acute finding or explanation for pain. Electronically Signed   By: Jorje Guild M.D.   On: 02/08/2023 21:10    Procedures Procedures  {Document cardiac monitor, telemetry assessment procedure when appropriate:1}  Medications Ordered in ED Medications  metoCLOPramide (REGLAN) injection 10 mg (has no administration in time range)  sodium chloride 0.9 % bolus 1,000 mL (0 mLs Intravenous Stopped 02/08/23 2109)  ondansetron (ZOFRAN) injection 4 mg (4 mg Intravenous Given 02/08/23 1857)  pantoprazole (PROTONIX) injection 40 mg (40 mg Intravenous Given 02/08/23 1857)  iohexol (OMNIPAQUE) 350 MG/ML injection 75 mL (75 mLs Intravenous Contrast Given 02/08/23 2107)    ED Course/ Medical Decision Making/ A&P    Patient seen and examined. History obtained directly from patient.  Reviewed lab workup from yesterday.  Lipase was normal at that time.  Labs/EKG: Ordered CBC, CMP.  Imaging: Ordered CT abdomen pelvis.  Medications/Fluids: Ordered: IV fluid bolus, Protonix, Zofran.   Most recent vital signs reviewed and are as follows: BP (!) 145/82   Pulse 75   Temp 98 F (36.7 C)   Resp 16   Ht 5\' 11"  (1.803 m)   Wt 73 kg   SpO2 98%   BMI 22.45 kg/m   Initial impression: Nausea and vomiting, suspect alcoholic gastritis, pending CT  9:19 PM Reassessment performed. Patient appears stable.  Per RN, patient has started vomiting again.  Labs personally reviewed and interpreted including: CBC unremarkable; CMP with potassium 3.4, glucose 105, LFTs normal.   Imaging personally visualized and interpreted including: CT abdomen pelvis, agree no acute findings.  Most current vital signs reviewed and are as  follows: BP (!) 145/82   Pulse 75   Temp 98 F (36.7 C)   Resp 16   Ht 5\' 11"  (1.803 m)   Wt 73 kg   SpO2 98%   BMI 22.45 kg/m   Plan: Additional antiemetic, Reglan ordered.  Will need p.o. challenge prior to discharge.     {   Click here for ABCD2, HEART and other calculatorsREFRESH Note before signing :1}                          Medical Decision Making Amount and/or Complexity of Data Reviewed Labs: ordered. Radiology: ordered.  Risk Prescription drug management.   ***  {Document critical care time when appropriate:1} {Document review of labs and clinical decision tools ie heart score, Chads2Vasc2 etc:1}  {Document your independent review of radiology images, and any outside records:1} {Document your discussion with family members, caretakers, and with consultants:1} {Document social determinants of health affecting pt's care:1} {Document your decision making why or why  not admission, treatments were needed:1} Final Clinical Impression(s) / ED Diagnoses Final diagnoses:  None    Rx / DC Orders ED Discharge Orders     None

## 2023-02-09 ENCOUNTER — Other Ambulatory Visit (HOSPITAL_COMMUNITY)
Admission: EM | Admit: 2023-02-09 | Discharge: 2023-02-12 | Disposition: A | Discharge status: Home / Self Care | Payer: Medicaid Other | Attending: Psychiatry | Admitting: Psychiatry

## 2023-02-09 DIAGNOSIS — Z1152 Encounter for screening for COVID-19: Secondary | ICD-10-CM | POA: Insufficient documentation

## 2023-02-09 DIAGNOSIS — F331 Major depressive disorder, recurrent, moderate: Secondary | ICD-10-CM | POA: Insufficient documentation

## 2023-02-09 DIAGNOSIS — F1010 Alcohol abuse, uncomplicated: Secondary | ICD-10-CM

## 2023-02-09 DIAGNOSIS — R4689 Other symptoms and signs involving appearance and behavior: Secondary | ICD-10-CM

## 2023-02-09 DIAGNOSIS — F339 Major depressive disorder, recurrent, unspecified: Secondary | ICD-10-CM

## 2023-02-09 DIAGNOSIS — F1020 Alcohol dependence, uncomplicated: Secondary | ICD-10-CM | POA: Insufficient documentation

## 2023-02-09 LAB — POC SARS CORONAVIRUS 2 AG: SARSCOV2ONAVIRUS 2 AG: NEGATIVE

## 2023-02-09 MED ORDER — LOPERAMIDE HCL 2 MG PO CAPS: 2.0000 mg | ORAL_CAPSULE | ORAL | Status: DC | PRN

## 2023-02-09 MED ORDER — THIAMINE MONONITRATE 100 MG PO TABS
100.0000 mg | ORAL_TABLET | Freq: Every day | ORAL | Status: DC
  Administered 2023-02-10 – 2023-02-12 (×3): 100 mg via ORAL
  Filled 2023-02-09 (×3): qty 1

## 2023-02-09 MED ORDER — ADULT MULTIVITAMIN W/MINERALS CH
1.0000 | ORAL_TABLET | Freq: Every day | ORAL | Status: DC
  Administered 2023-02-09 – 2023-02-12 (×4): 1 via ORAL
  Filled 2023-02-09 (×4): qty 1

## 2023-02-09 MED ORDER — ALUM & MAG HYDROXIDE-SIMETH 200-200-20 MG/5ML PO SUSP: 30.0000 mL | ORAL | Status: DC | PRN

## 2023-02-09 MED ORDER — LORAZEPAM 1 MG PO TABS
1.0000 mg | ORAL_TABLET | Freq: Four times a day (QID) | ORAL | Status: DC | PRN
  Administered 2023-02-11: 1 mg via ORAL

## 2023-02-09 MED ORDER — LORAZEPAM 1 MG PO TABS: 1.0000 mg | ORAL_TABLET | Freq: Every day | ORAL | Status: DC

## 2023-02-09 MED ORDER — LORAZEPAM 1 MG PO TABS
1.0000 mg | ORAL_TABLET | Freq: Two times a day (BID) | ORAL | Status: DC
  Administered 2023-02-12: 1 mg via ORAL
  Filled 2023-02-09 (×2): qty 1

## 2023-02-09 MED ORDER — OLANZAPINE 5 MG PO TBDP: 5.0000 mg | ORAL_TABLET | Freq: Three times a day (TID) | ORAL | Status: DC | PRN

## 2023-02-09 MED ORDER — HYDROXYZINE HCL 25 MG PO TABS
25.0000 mg | ORAL_TABLET | Freq: Four times a day (QID) | ORAL | Status: DC | PRN
  Administered 2023-02-11: 25 mg via ORAL
  Filled 2023-02-09: qty 1

## 2023-02-09 MED ORDER — ZIPRASIDONE MESYLATE 20 MG IM SOLR: 20.0000 mg | INTRAMUSCULAR | Status: DC | PRN

## 2023-02-09 MED ORDER — ACETAMINOPHEN 325 MG PO TABS: 650.0000 mg | ORAL_TABLET | Freq: Four times a day (QID) | ORAL | Status: DC | PRN

## 2023-02-09 MED ORDER — LORAZEPAM 1 MG PO TABS
1.0000 mg | ORAL_TABLET | Freq: Four times a day (QID) | ORAL | Status: AC
  Administered 2023-02-10 (×3): 1 mg via ORAL
  Filled 2023-02-09 (×4): qty 1

## 2023-02-09 MED ORDER — LORAZEPAM 1 MG PO TABS
1.0000 mg | ORAL_TABLET | Freq: Three times a day (TID) | ORAL | Status: AC
  Administered 2023-02-10 – 2023-02-11 (×3): 1 mg via ORAL
  Filled 2023-02-09 (×3): qty 1

## 2023-02-09 MED ORDER — THIAMINE HCL 100 MG/ML IJ SOLN
100.0000 mg | Freq: Once | INTRAMUSCULAR | Status: AC
  Administered 2023-02-09: 100 mg via INTRAMUSCULAR
  Filled 2023-02-09: qty 2

## 2023-02-09 MED ORDER — ONDANSETRON 4 MG PO TBDP: 4.0000 mg | ORAL_TABLET | Freq: Four times a day (QID) | ORAL | Status: DC | PRN

## 2023-02-09 MED ORDER — MAGNESIUM HYDROXIDE 400 MG/5ML PO SUSP: 30.0000 mL | Freq: Every day | ORAL | Status: DC | PRN

## 2023-02-09 NOTE — ED Notes (Signed)
Pt is A & O x 4. Pt is oriented to the unit and provided with meal. Medication has been administered to him and pt is lying on his bed. Pt denies SI/HI/AVH. Will continue to monitor. 

## 2023-02-09 NOTE — ED Triage Notes (Signed)
Pt presents to [facility] voluntarily, accompanied by his mother requesting substance/alcohol abuse treatment. Pt reports going to [facility] ED yesterday for alcohol poisoning and dehydration. Pt reports drinking heavily the past few weeks, but last drank on Saturday. Pt reports drinking a lot of beer and liquor (could not remember specific amount) and had been drinking daily for about a year up until now. Pt stated history of depression and SI. Pt reports feeling suicidal earlier today, but denies at this time and can contract for safety. Pt currently denies SI, HI, AVH.

## 2023-02-09 NOTE — ED Provider Notes (Signed)
Facility Based Crisis Admission H&P  Date: 02/10/23 Patient Name: Victor Stanley MRN: [id number] Chief Complaint: alcohol detox   Diagnoses:  Final diagnoses:  Alcohol abuse  Recurrent major depressive disorder, remission status unspecified ([place])  Behavior concern    HPI: Victor Stanley 22 y/o male with a history of alcohol abuse, depression, presented to [facility] voluntarily accompanied by his mother.  Per the patient he wants to get detox from alcohol, according to the patient he has been sick since Saturday had 2-3 ER visits and was diagnosed with alcohol gastritis.  According to the patient he  tried detox last year but then relapsed, patient lives with his mother patient is unemployed.  According to the patient he last drank Saturday.  According to the patient he drank every day.  Patient denies having a psychiatrist or therapist at this time.   Face-to-face observation of patient, patient is alert and oriented x 4, speech is clear, maintaining eye contact.  Patient answers all questions appropriately.  Patient denies SI, HI, AVH or paranoia at this time however patient stated because of his depression he drinks a lot.  According to patient he wants to turn his life around patient stated he is about to turn 22 and does not want to continue drinking.  According to patient he consume alcohol whenever he can most days.  According to patient he smoked marijuana last time today denies any other illicit drug use.  According to patient he had nausea and vomiting today around 4 PM however that has subsided.   Recommend inpatient [facility]  PHQ 2-9:  [place] ED from 07/23/2022 in [facility]  Thoughts that you would be better off dead, or of hurting yourself in some way Not at all  PHQ-9 Total Score 0       [place] ED from 02/09/2023 in [facility] ED from 02/08/2023 in [facility] Emergency Department at [facility]  ED from 02/07/2023 in [facility] Emergency Department at [place] No Risk No Risk No Risk        Total Time spent with patient: 30 minutes  Musculoskeletal  Strength & Muscle Tone: within normal limits Gait & Station: normal Patient leans: N/A  Psychiatric Specialty Exam  Presentation General Appearance:  Casual  Eye Contact: Fair  Speech: Clear and Coherent  Speech Volume: Normal  Handedness: Right   Mood and Affect  Mood: Anxious; Depressed  Affect: Congruent   Thought Process  Thought Processes: Coherent  Descriptions of Associations:Circumstantial  Orientation:Full (Time, Place and Person)  Thought Content:WDL    Hallucinations:Hallucinations: None  Ideas of Reference:None  Suicidal Thoughts:Suicidal Thoughts: No  Homicidal Thoughts:Homicidal Thoughts: No   Sensorium  Memory: Immediate Fair  Judgment: Poor  Insight: Fair   Materials engineer: Fair  Attention Span: Fair  Recall: [place] of Knowledge: Fair  Language: Fair   Psychomotor Activity  Psychomotor Activity: Psychomotor Activity: Normal   Assets  Assets: Desire for Improvement   Sleep  Sleep: Sleep: Fair   Nutritional Assessment (For OBS and FBC admissions only) Has the patient had a weight loss or gain of 10 pounds or more in the last 3 months?: No Has the patient had a decrease in food intake/or appetite?: No Does the patient have dental problems?: No Does the patient have eating habits or behaviors that may be indicators of an eating disorder including binging or inducing vomiting?: No Has  the patient recently lost weight without trying?: 0 Has the patient been eating poorly because of a decreased appetite?: 0 Malnutrition Screening Tool Score: 0    Physical Exam HENT:     Head: Normocephalic.     Nose: Nose normal.  Cardiovascular:     Rate and Rhythm: Normal rate.  Pulmonary:      Effort: Pulmonary effort is normal.  Musculoskeletal:        General: Normal range of motion.     Cervical back: Normal range of motion.  Neurological:     General: No focal deficit present.     Mental Status: He is alert.  Psychiatric:        Mood and Affect: Mood normal.        Behavior: Behavior normal.        Thought Content: Thought content normal.        Judgment: Judgment normal.    Review of Systems  Constitutional: Negative.   HENT: Negative.    Eyes: Negative.   Respiratory: Negative.    Cardiovascular: Negative.   Gastrointestinal: Negative.   Genitourinary: Negative.   Musculoskeletal: Negative.   Skin: Negative.   Neurological: Negative.   Psychiatric/Behavioral:  Positive for depression and substance abuse. The patient is nervous/anxious.     Blood pressure 121/70, pulse (!) 50, temperature 98.7 F (37.1 C), temperature source Oral, resp. rate 18, SpO2 100 %. There is no height or weight on file to calculate BMI.  Past Psychiatric History: Acute alcohol gastritis, alcohol intoxication, depression  Is the patient at risk to self? No  Has the patient been a risk to self in the past 6 months? No .    Has the patient been a risk to self within the distant past? No   Is the patient a risk to others? No   Has the patient been a risk to others in the past 6 months? No   Has the patient been a risk to others within the distant past? No   Past Medical History: See chart Family History: Unknown Social History: Marijuana use, alcohol use  Last Labs:  Admission on 02/09/2023  Component Date Value Ref Range Status   SARS Coronavirus 2 by RT PCR 02/09/2023 NEGATIVE  NEGATIVE Final   Influenza A by PCR 02/09/2023 NEGATIVE  NEGATIVE Final   Influenza B by PCR 02/09/2023 NEGATIVE  NEGATIVE Final   Comment: (NOTE) The Xpert Xpress SARS-CoV-2/FLU/RSV plus assay is intended as an aid in the diagnosis of influenza from Nasopharyngeal swab specimens and should not be used  as a sole basis for treatment. Nasal washings and aspirates are unacceptable for Xpert Xpress SARS-CoV-2/FLU/RSV testing.  Fact Sheet for Patients: EntrepreneurPulse.com.au  Fact Sheet for Healthcare Providers: IncredibleEmployment.be  This test is not yet approved or cleared by the [place] FDA and has been authorized for detection and/or diagnosis of SARS-CoV-2 by FDA under an Emergency Use Authorization (EUA). This EUA will remain in effect (meaning this test can be used) for the duration of the COVID-19 declaration under Section 564(b)(1) of the Act, 21 U.S.C. section 360bbb-3(b)(1), unless the authorization is terminated or revoked.     Resp Syncytial Virus by PCR 02/09/2023 NEGATIVE  NEGATIVE Final   Comment: (NOTE) Fact Sheet for Patients: EntrepreneurPulse.com.au  Fact Sheet for Healthcare Providers: IncredibleEmployment.be  This test is not yet approved or cleared by the [place] FDA and has been authorized for detection and/or diagnosis of SARS-CoV-2 by FDA under an Emergency Use Authorization (EUA).  This EUA will remain in effect (meaning this test can be used) for the duration of the COVID-19 declaration under Section 564(b)(1) of the Act, 21 U.S.C. section 360bbb-3(b)(1), unless the authorization is terminated or revoked.  Performed at [facility], [place] [address] [place], [place] [address]    SARSCOV2ONAVIRUS 2 AG 02/09/2023 NEGATIVE  NEGATIVE Final   Comment: (NOTE) SARS-CoV-2 antigen NOT DETECTED.   Negative results are presumptive.  Negative results do not preclude SARS-CoV-2 infection and should not be used as the sole basis for treatment or other patient management decisions, including infection  control decisions, particularly in the presence of clinical signs and  symptoms consistent with COVID-19, or in those who have been in contact with the virus.  Negative  results must be combined with clinical observations, patient history, and epidemiological information. The expected result is Negative.  Fact Sheet for Patients: HandmadeRecipes.com.cy  Fact Sheet for Healthcare Providers: FuneralLife.at  This test is not yet approved or cleared by the [place] FDA and  has been authorized for detection and/or diagnosis of SARS-CoV-2 by FDA under an Emergency Use Authorization (EUA).  This EUA will remain in effect (meaning this test can be used) for the duration of  the COV                          ID-19 declaration under Section 564(b)(1) of the Act, 21 U.S.C. section 360bbb-3(b)(1), unless the authorization is terminated or revoked sooner.    Admission on 02/08/2023, Discharged on 02/08/2023  Component Date Value Ref Range Status   WBC 02/08/2023 8.1  4.0 - 10.5 K/uL Final   RBC 02/08/2023 4.59  4.22 - 5.81 MIL/uL Final   Hemoglobin 02/08/2023 13.1  13.0 - 17.0 g/dL Final   HCT 02/08/2023 39.1  39.0 - 52.0 % Final   MCV 02/08/2023 85.2  80.0 - 100.0 fL Final   MCH 02/08/2023 28.5  26.0 - 34.0 pg Final   MCHC 02/08/2023 33.5  30.0 - 36.0 g/dL Final   RDW 02/08/2023 13.9  11.5 - 15.5 % Final   Platelets 02/08/2023 318  150 - 400 K/uL Final   nRBC 02/08/2023 0.0  0.0 - 0.2 % Final   Neutrophils Relative % 02/08/2023 66  % Final   Neutro Abs 02/08/2023 5.3  1.7 - 7.7 K/uL Final   Lymphocytes Relative 02/08/2023 24  % Final   Lymphs Abs 02/08/2023 1.9  0.7 - 4.0 K/uL Final   Monocytes Relative 02/08/2023 10  % Final   Monocytes Absolute 02/08/2023 0.8  0.1 - 1.0 K/uL Final   Eosinophils Relative 02/08/2023 0  % Final   Eosinophils Absolute 02/08/2023 0.0  0.0 - 0.5 K/uL Final   Basophils Relative 02/08/2023 0  % Final   Basophils Absolute 02/08/2023 0.0  0.0 - 0.1 K/uL Final   Immature Granulocytes 02/08/2023 0  % Final   Abs Immature Granulocytes 02/08/2023 0.02  0.00 - 0.07 K/uL Final    Performed at [facility], [place] [address], [place], [place] [address]   Sodium 02/08/2023 139  135 - 145 mmol/L Final   Potassium 02/08/2023 3.4 (L)  3.5 - 5.1 mmol/L Final   Chloride 02/08/2023 104  98 - 111 mmol/L Final   CO2 02/08/2023 25  22 - 32 mmol/L Final   Glucose, Bld 02/08/2023 105 (H)  70 - 99 mg/dL Final   Glucose reference range applies only to samples taken after fasting for  at least 8 hours.   BUN 02/08/2023 8  6 - 20 mg/dL Final   Creatinine, Ser 02/08/2023 0.98  0.61 - 1.24 mg/dL Final   Calcium 02/08/2023 9.4  8.9 - 10.3 mg/dL Final   Total Protein 02/08/2023 7.6  6.5 - 8.1 g/dL Final   Albumin 02/08/2023 4.5  3.5 - 5.0 g/dL Final   AST 02/08/2023 22  15 - 41 U/L Final   ALT 02/08/2023 26  0 - 44 U/L Final   Alkaline Phosphatase 02/08/2023 49  38 - 126 U/L Final   Total Bilirubin 02/08/2023 1.1  0.3 - 1.2 mg/dL Final   GFR, Estimated 02/08/2023 >60  >60 mL/min Final   Comment: (NOTE) Calculated using the CKD-EPI Creatinine Equation (2021)    Anion gap 02/08/2023 10  5 - 15 Final   Performed at [place] [address], [place], [place] [address]  Admission on 02/07/2023, Discharged on 02/07/2023  Component Date Value Ref Range Status   WBC 02/07/2023 8.0  4.0 - 10.5 K/uL Final   RBC 02/07/2023 4.81  4.22 - 5.81 MIL/uL Final   Hemoglobin 02/07/2023 13.8  13.0 - 17.0 g/dL Final   HCT 02/07/2023 40.6  39.0 - 52.0 % Final   MCV 02/07/2023 84.4  80.0 - 100.0 fL Final   MCH 02/07/2023 28.7  26.0 - 34.0 pg Final   MCHC 02/07/2023 34.0  30.0 - 36.0 g/dL Final   RDW 02/07/2023 14.2  11.5 - 15.5 % Final   Platelets 02/07/2023 409 (H)  150 - 400 K/uL Final   nRBC 02/07/2023 0.0  0.0 - 0.2 % Final   Neutrophils Relative % 02/07/2023 71  % Final   Neutro Abs 02/07/2023 5.7  1.7 - 7.7 K/uL Final   Lymphocytes Relative 02/07/2023 20  % Final   Lymphs Abs 02/07/2023 1.6  0.7 - 4.0 K/uL Final   Monocytes Relative 02/07/2023 7  % Final   Monocytes Absolute  02/07/2023 0.5  0.1 - 1.0 K/uL Final   Eosinophils Relative 02/07/2023 1  % Final   Eosinophils Absolute 02/07/2023 0.1  0.0 - 0.5 K/uL Final   Basophils Relative 02/07/2023 1  % Final   Basophils Absolute 02/07/2023 0.0  0.0 - 0.1 K/uL Final   Immature Granulocytes 02/07/2023 0  % Final   Abs Immature Granulocytes 02/07/2023 0.03  0.00 - 0.07 K/uL Final   Performed at [facility], [place] [address], [place], [place] [address]   Sodium 02/07/2023 136  135 - 145 mmol/L Final   Potassium 02/07/2023 3.6  3.5 - 5.1 mmol/L Final   Chloride 02/07/2023 105  98 - 111 mmol/L Final   CO2 02/07/2023 23  22 - 32 mmol/L Final   Glucose, Bld 02/07/2023 125 (H)  70 - 99 mg/dL Final   Glucose reference range applies only to samples taken after fasting for at least 8 hours.   BUN 02/07/2023 11  6 - 20 mg/dL Final   Creatinine, Ser 02/07/2023 0.88  0.61 - 1.24 mg/dL Final   Calcium 02/07/2023 8.5 (L)  8.9 - 10.3 mg/dL Final   Total Protein 02/07/2023 6.9  6.5 - 8.1 g/dL Final   Albumin 02/07/2023 4.1  3.5 - 5.0 g/dL Final   AST 02/07/2023 29  15 - 41 U/L Final   ALT 02/07/2023 23  0 - 44 U/L Final   Alkaline Phosphatase 02/07/2023 47  38 - 126 U/L Final   Total Bilirubin 02/07/2023 0.9  0.3 - 1.2  mg/dL Final   GFR, Estimated 02/07/2023 >60  >60 mL/min Final   Comment: (NOTE) Calculated using the CKD-EPI Creatinine Equation (2021)    Anion gap 02/07/2023 8  5 - 15 Final   Performed at [facility], [place] [address] [place], [place] [address]   Lipase 02/07/2023 42  11 - 51 U/L Final   Performed at [place] [address], [place], [place] [address]   Magnesium 02/07/2023 2.0  1.7 - 2.4 mg/dL Final   Performed at [facility] [address] [place], [place] [address]    Allergies: Patient has no known allergies.  Medications:  Facility Ordered Medications  Medication   acetaminophen (TYLENOL) tablet 650 mg   alum & mag hydroxide-simeth (MAALOX/MYLANTA) 200-200-20  MG/5ML suspension 30 mL   magnesium hydroxide (MILK OF MAGNESIA) suspension 30 mL   [COMPLETED] thiamine (VITAMIN B1) injection 100 mg   thiamine (VITAMIN B1) tablet 100 mg   multivitamin with minerals tablet 1 tablet   LORazepam (ATIVAN) tablet 1 mg   hydrOXYzine (ATARAX) tablet 25 mg   loperamide (IMODIUM) capsule 2-4 mg   ondansetron (ZOFRAN-ODT) disintegrating tablet 4 mg   LORazepam (ATIVAN) tablet 1 mg   Followed by   LORazepam (ATIVAN) tablet 1 mg   Followed by   Derrill Memo ON 02/11/2023] LORazepam (ATIVAN) tablet 1 mg   Followed by   Derrill Memo ON 02/13/2023] LORazepam (ATIVAN) tablet 1 mg   OLANZapine zydis (ZYPREXA) disintegrating tablet 5 mg   And   ziprasidone (GEODON) injection 20 mg   PTA Medications  Medication Sig   escitalopram (LEXAPRO) 20 MG tablet Take 1 tablet (20 mg total) by mouth daily.   sucralfate (CARAFATE) 1 g tablet Take 1 tablet (1 g total) by mouth 4 (four) times daily -  with meals and at bedtime.   famotidine (PEPCID) 20 MG tablet Take 1 tablet (20 mg total) by mouth 2 (two) times daily.   metoCLOPramide (REGLAN) 10 MG tablet Take 1 tablet (10 mg total) by mouth every 6 (six) hours.    Long Term Goals: Improvement in symptoms so as ready for discharge  Short Term Goals: Patient will verbalize feelings in meetings with treatment team members., Patient will attend at least of 50% of the groups daily., Pt will complete the PHQ9 on admission, day 3 and discharge., Patient will participate in completing the [place] Suicide Severity Rating Scale, and Patient will score a low risk of violence for 24 hours prior to discharge  Medical Decision Making  Inpatient [facility]   Lab Orders         Resp panel by RT-PCR (RSV, Flu A&B, Covid) Anterior Nasal Swab         POC SARS Coronavirus 2 Ag      Meds ordered this encounter  Medications   acetaminophen (TYLENOL) tablet 650 mg   alum & mag hydroxide-simeth (MAALOX/MYLANTA) 200-200-20 MG/5ML suspension 30 mL   magnesium  hydroxide (MILK OF MAGNESIA) suspension 30 mL   thiamine (VITAMIN B1) injection 100 mg   thiamine (VITAMIN B1) tablet 100 mg   multivitamin with minerals tablet 1 tablet   LORazepam (ATIVAN) tablet 1 mg   hydrOXYzine (ATARAX) tablet 25 mg   loperamide (IMODIUM) capsule 2-4 mg   ondansetron (ZOFRAN-ODT) disintegrating tablet 4 mg   FOLLOWED BY Linked Order Group    LORazepam (ATIVAN) tablet 1 mg    LORazepam (ATIVAN) tablet 1 mg    LORazepam (ATIVAN) tablet 1 mg  LORazepam (ATIVAN) tablet 1 mg   AND Linked Order Group    OLANZapine zydis (ZYPREXA) disintegrating tablet 5 mg    ziprasidone (GEODON) injection 20 mg     Recommendations  Based on my evaluation the patient appears to have an emergency medical condition for which I recommend the patient be transferred to the emergency department for further evaluation.  Evette Georges, NP 02/10/23  5:29 AM

## 2023-02-09 NOTE — BH Assessment (Addendum)
Comprehensive Clinical Assessment (CCA) Note  02/09/2023 Victor Stanley:[id number] Disposition: Pt was brought to [facility] by his mother voluntarily.  Patient was seen for triage by Mayford Knife, NT.  This clinician completed the CCA.  Patient was seen by NP Evette Georges who completed the MSE. Roy recommended patient for [facility].  Patient is cooperative and oriented x4.  He maintains good eye contact and his responses are appropriate.  Pt is not responding to internal stimuli.  Nor is he displaying any delusional thought processes.  Pt reports appetite to be WNL and same for sleep.  Patient says he wants to not drink anymore so that he can get a job and be responsible.  Patient has no current outpatient care.  He said he was seen in the past at [facility] and is interested in IOP option and getting back on medication.   Chief Complaint:  Chief Complaint  Patient presents with   Alcohol Problem   Visit Diagnosis: ETOH use d/o severe; MDD recurrent, moderate   CCA Screening, Triage and Referral (STR)  Patient Reported Information How did you hear about [place]? Family/Friend  What Is the Reason for Your Visit/Call Today? Pt presents to [facility] voluntarily, accompanied by his mother requesting substance/alcohol abuse treatment. Pt reports going to [facility] ED yesterday for alcohol poisoning and dehydration. Pt reports drinking heavily the past few weeks, but last drank on Saturday. Pt reports drinking a lot of beer and liquor (could not remember specific amount) and had been drinking daily for about a year up until now. Pt stated history of depression and SI. Pt reports feeling suicidal earlier today, but denies at this time and can contract for safety. Pt currently denies SI, HI, AVH.  How Long Has This Been Causing You Problems? 1-6 months  What Do You Feel Would Help You the Most Today? Alcohol or Drug Use Treatment; Treatment for Depression or other mood problem   Have You Recently Had  Any Thoughts About Hurting Yourself? Yes  Are You Planning to Commit Suicide/Harm Yourself At This time? No   Flowsheet Row ED from 02/09/2023 in [facility] ED from 02/08/2023 in [facility] Emergency Department at [facility] ED from 02/07/2023 in [facility] Emergency Department at [place] No Risk No Risk No Risk       Have you Recently Had Thoughts About [place]? No  Are You Planning to Harm Someone at This Time? No  Explanation: Pt denies any current SI or HI.   Have You Used Any Alcohol or Drugs in the Past 24 Hours? No  What Did You Use and How Much? None in the last 24 hours   Do You Currently Have a Therapist/Psychiatrist? No  Name of Therapist/Psychiatrist: Name of Therapist/Psychiatrist: Pt has no current outpatient care.   Have You Been Recently Discharged From Any Office Practice or Programs? No  Explanation of Discharge From Practice/Program: N/A     CCA Screening Triage Referral Assessment Type of Contact: Face-to-Face  Telemedicine Service Delivery:   Is this Initial or Reassessment?   Date Telepsych consult ordered in [facility]:    Time Telepsych consult ordered in [facility]:    Location of Assessment: [facility] Assessment Services  Provider Location: [facility] Assessment Services   Collateral Involvement: Pt consented to have mother contacted if needed.  Mother is Sherle Poe [phone]   Does Patient Have a Court Appointed Legal Guardian? No  Legal Guardian Contact Information: Pt does not have a guardian  Copy of Legal Guardianship Form: -- (Pt does not have a guardian.)  Legal Guardian Notified of Arrival: -- (No gurdian)  Legal Guardian Notified of Pending Discharge: -- (No guardian)  If Minor and Not Living with Parent(s), Who has Custody? Pt is an adult  Is CPS involved or ever been involved? Never  Is APS involved or ever been involved? Never   Patient  Determined To Be At Risk for Harm To Self or Others Based on Review of Patient Reported Information or Presenting Complaint? No  Method: No Plan  Availability of Means: No access or NA  Intent: -- (No current SI or HI.)  Notification Required: No need or identified person  Additional Information for Danger to Others Potential: -- (Pt has no HI.)  Additional Comments for Danger to Others Potential: Pt denies any HI.  Are There Guns or Other Weapons in [place]? No  Types of Guns/Weapons: None  Are These Weapons Safely Secured?                            No (No weapons to safety secure.)  Who Could Verify You Are Able To Have These Secured: No one, no weapons.  Do You Have any Outstanding Charges, Pending Court Dates, Parole/Probation? Pt denies  Contacted To Inform of Risk of Harm To Self or Others: Other: Comment (Pt denies any SI or HI.)    Does Patient Present under Involuntary Commitment? No    [place] of Residence: [place]   Patient Currently Receiving the Following Services: Not Receiving Services   Determination of Need: Urgent (48 hours)   Options For Referral: Facility-Based Crisis     CCA Biopsychosocial Patient Reported Schizophrenia/Schizoaffective Diagnosis in Past: No   Strengths: Pt says he is very athletic   Mental Health Symptoms Depression:   Change in energy/activity; Hopelessness; Worthlessness; Sleep (too much or little); Increase/decrease in appetite   Duration of Depressive symptoms:  Duration of Depressive Symptoms: Greater than two weeks   Mania:   Racing thoughts; Increased Energy; Recklessness   Anxiety:    Sleep; Restlessness; Worrying; Tension   Psychosis:   None   Duration of Psychotic symptoms:    Trauma:   Avoids reminders of event; Detachment from others; Difficulty staying/falling asleep; Emotional numbing   Obsessions:   Good insight; Attempts to suppress/neutralize   Compulsions:   Good insight    Inattention:   Disorganized; Forgetful   Hyperactivity/Impulsivity:   Feeling of restlessness; Fidgets with hands/feet   Oppositional/Defiant Behaviors:   None   Emotional Irregularity:   Chronic feelings of emptiness; Potentially harmful impulsivity   Other Mood/Personality Symptoms:   Has SU problems    Mental Status Exam Appearance and self-care  Stature:   Average   Weight:   Average weight   Clothing:   Casual   Grooming:   Normal   Cosmetic use:   None   Posture/gait:   Normal   Motor activity:   Not Remarkable   Sensorium  Attention:   Normal   Concentration:   Normal   Orientation:   X5   Recall/memory:   Defective in Short-term   Affect and Mood  Affect:   Anxious; Appropriate; Blunted   Mood:   Depressed   Relating  Eye contact:   Normal   Facial expression:   Responsive   Attitude toward examiner:   Cooperative  Thought and Language  Speech flow:  Normal   Thought content:   Appropriate to Mood and Circumstances   Preoccupation:   None   Hallucinations:   None   Organization:   Coherent; Goal-directed   [employer] of Knowledge:   Fair   Intelligence:   Average   Abstraction:   Functional   Judgement:   Fair   Building surveyor   Insight:   Gaps   Decision Making:   Impulsive   Social Functioning  Social Maturity:   Isolates   Social Judgement:   "Games developer"   Stress  Stressors:   Museum/gallery curator; Work; Other (Comment) (Identifies drinking as a stressor.)   Coping Ability:   Programme researcher, broadcasting/film/video Deficits:   Environmental health practitioner; Self-control   Supports:   Family; Friends/Service system     Religion: Religion/Spirituality Are You A Religious Person?: Yes What is Your Religious Affiliation?: Christian How Might This Affect Treatment?: No affect  Leisure/Recreation: Leisure / Recreation Do You Have Hobbies?: Yes Leisure and Hobbies: Drawing and  skateboarding  Exercise/Diet: Exercise/Diet Do You Exercise?: Yes What Type of Exercise Do You Do?: Run/Walk, Hiking How Many Times a Week Do You Exercise?: Daily Have You Gained or Lost A Significant Amount of Weight in the Past Six Months?: Yes-Gained Number of Pounds Gained: 15 Do You Follow a Special Diet?: No Do You Have Any Trouble Sleeping?: Yes Explanation of Sleeping Difficulties: About 6-7   CCA Employment/Education Employment/Work Situation: Employment / Work Situation Employment Situation: Unemployed Patient's Job has Been Impacted by Current Illness: No Has Patient ever Been in Passenger transport manager?: No  Education: Education Is Patient Currently Attending School?: No Last Grade Completed: 9 Did You Nutritional therapist?: No Did You Have An Individualized Education Program (IIEP): No Did You Have Any Difficulty At [employer]?: Yes Were Any Medications Ever Prescribed For These Difficulties?: No Patient's Education Has Been Impacted by Current Illness: No   CCA Family/Childhood History Family and Relationship History: Family history Marital status: Single Does patient have children?: No  Childhood History:  Childhood History By whom was/is the patient raised?: Mother Did patient suffer any verbal/emotional/physical/sexual abuse as a child?: No Did patient suffer from severe childhood neglect?: No Has patient ever been sexually abused/assaulted/raped as an adolescent or adult?: No Was the patient ever a victim of a crime or a disaster?: No Witnessed domestic violence?: No Has patient been affected by domestic violence as an adult?: No       CCA Substance Use Alcohol/Drug Use: Alcohol / Drug Use Pain Medications: See MAR Prescriptions: Pt has not had mediation in months. Over the Counter: None History of alcohol / drug use?: Yes Longest period of sobriety (when/how long): 3 months and 15 days Negative Consequences of Use: Personal relationships Withdrawal Symptoms:  Patient aware of relationship between substance abuse and physical/medical complications, Weakness, Sweats, Fever / Chills Substance #1 Name of Substance 1: ETOH (mainly beer) 1 - Age of First Use: 22 years of age 59 - Amount (size/oz): Four 40oz a day 1 - Frequency: Daily 1 - Duration: "months" 1 - Last Use / Amount: 02/05/23 15 beers and some liquor 1 - Method of Aquiring: purchase 1- Route of Use: oral Substance #2 Name of Substance 2: Marijuana 2 - Age of First Use: 22 years of age 42 - Amount (size/oz): A blunt maybe three times in a day 2 - Frequency: Three times in a week 2 - Duration: ongoing 2 -  Last Use / Amount: 02/09/23 A blunt 2 - Method of Aquiring: illegal purchase 2 - Route of Substance Use: inhalation                     ASAM's:  Six Dimensions of Multidimensional Assessment  Dimension 1:  Acute Intoxication and/or Withdrawal Potential:   Dimension 1:  Description of individual's past and current experiences of substance use and withdrawal: Pt has had fatigue, chills.  Feels like he has to drink in the morning to get started.  Dimension 2:  Biomedical Conditions and Complications:   Dimension 2:  Description of patient's biomedical conditions and  complications: No ongoing chronic medical issues  Dimension 3:  Emotional, Behavioral, or Cognitive Conditions and Complications:  Dimension 3:  Description of emotional, behavioral, or cognitive conditions and complications: Pt suffers from depression which gets worse when drinking.  Dimension 4:  Readiness to Change:  Dimension 4:  Description of Readiness to Change criteria: Pt says he is ready to change "right now."  Pt wants to straihten out and be responsible with his life choices.  Dimension 5:  Relapse, Continued use, or Continued Problem Potential:  Dimension 5:  Relapse, continued use, or continued problem potential critiera description: Pt is not sure what his relapses potential is.  Dimension 6:   Recovery/Living Environment:  Dimension 6:  Recovery/Iiving environment criteria description: Pt says that home environment is not conducive to recovery.  Family members drink but hs is hopeful that he can be a positive influence on them to reduce use or stop altogether.  ASAM Severity Score: ASAM's Severity Rating Score: 7  ASAM Recommended Level of Treatment: ASAM Recommended Level of Treatment: Level II Partial Hospitalization Treatment   Substance use Disorder (SUD) Substance Use Disorder (SUD)  Checklist Symptoms of Substance Use: Evidence of tolerance, Recurrent use that results in a failure to fulfill major role obligations (work, school, home), Presence of craving or strong urge to use, Continued use despite persistent or recurrent social, interpersonal problems, caused or exacerbated by use, Continued use despite having a persistent/recurrent physical/psychological problem caused/exacerbated by use, Social, occupational, recreational activities given up or reduced due to use, Persistent desire or unsuccessful efforts to cut down or control use  Recommendations for Services/Supports/Treatments: Recommendations for Services/Supports/Treatments Recommendations For Services/Supports/Treatments: Facility Based Crisis, CD-IOP Intensive Chemical Dependency Program  Discharge Disposition:    DSM5 Diagnoses: Patient Active Problem List   Diagnosis Date Noted   Alcohol abuse 02/09/2023   Suicidal ideation 07/26/2022   Alcohol use disorder 07/26/2022   History of non-suicidal self-harm 07/26/2022   H/O suicide attempt 07/26/2022   MDD (major depressive disorder), recurrent severe, without psychosis ([place]) 07/23/2022   Acute asthma exacerbation 07/01/2012     Referrals to Alternative Service(s): Referred to Alternative Service(s):   Place:   Date:   Time:    Referred to Alternative Service(s):   Place:   Date:   Time:    Referred to Alternative Service(s):   Place:   Date:   Time:     Referred to Alternative Service(s):   Place:   Date:   Time:     [place] Session

## 2023-02-10 ENCOUNTER — Encounter (HOSPITAL_COMMUNITY): Payer: Self-pay

## 2023-02-10 DIAGNOSIS — Z1152 Encounter for screening for COVID-19: Secondary | ICD-10-CM | POA: Diagnosis not present

## 2023-02-10 DIAGNOSIS — F1020 Alcohol dependence, uncomplicated: Secondary | ICD-10-CM | POA: Diagnosis not present

## 2023-02-10 DIAGNOSIS — F331 Major depressive disorder, recurrent, moderate: Secondary | ICD-10-CM | POA: Diagnosis not present

## 2023-02-10 LAB — POCT URINE DRUG SCREEN - MANUAL ENTRY (I-SCREEN)
POC Amphetamine UR: NOT DETECTED
POC Buprenorphine (BUP): NOT DETECTED
POC Cocaine UR: NOT DETECTED — AB
POC Marijuana UR: POSITIVE — AB
POC Methadone UR: NOT DETECTED
POC Methamphetamine UR: NOT DETECTED
POC Morphine: NOT DETECTED
POC Oxazepam (BZO): POSITIVE — NL
POC Oxycodone UR: NOT DETECTED
POC Secobarbital (BAR): NOT DETECTED

## 2023-02-10 LAB — RESP PANEL BY RT-PCR (RSV, FLU A&B, COVID)  RVPGX2
Influenza A by PCR: NEGATIVE
Influenza B by PCR: NEGATIVE
Resp Syncytial Virus by PCR: NEGATIVE
SARS Coronavirus 2 by RT PCR: NEGATIVE

## 2023-02-10 MED ORDER — TRIAMCINOLONE ACETONIDE 0.1 % EX CREA
TOPICAL_CREAM | Freq: Two times a day (BID) | CUTANEOUS | Status: DC
  Administered 2023-02-10: 1 via TOPICAL
  Filled 2023-02-10: qty 15

## 2023-02-10 MED ORDER — NYSTATIN-TRIAMCINOLONE 100000-0.1 UNIT/GM-% EX CREA: 1.0000 | TOPICAL_CREAM | Freq: Two times a day (BID) | CUTANEOUS | Status: DC

## 2023-02-10 MED ORDER — ESCITALOPRAM OXALATE 5 MG PO TABS
5.0000 mg | ORAL_TABLET | Freq: Every day | ORAL | Status: DC
  Administered 2023-02-10 – 2023-02-12 (×3): 5 mg via ORAL
  Filled 2023-02-10 (×3): qty 1

## 2023-02-10 MED ORDER — NYSTATIN 100000 UNIT/GM EX CREA
TOPICAL_CREAM | Freq: Two times a day (BID) | CUTANEOUS | Status: DC
  Filled 2023-02-10: qty 30

## 2023-02-10 MED ORDER — POTASSIUM CHLORIDE CRYS ER 10 MEQ PO TBCR
10.0000 meq | EXTENDED_RELEASE_TABLET | Freq: Once | ORAL | Status: AC
  Administered 2023-02-10: 10 meq via ORAL
  Filled 2023-02-10: qty 1

## 2023-02-10 NOTE — ED Notes (Signed)
Patient is calm and cooperative without distress or complaint.  He is pleasant and is without withdrawal.  Will monitor.

## 2023-02-10 NOTE — ED Notes (Signed)
Pt sitting in cafeteria eating lunch and  interacting with peers. No acute distress noted. No concerns voiced. Informed pt to notify staff with any needs or assistance. Pt verbalized understanding or agreement. Will continue to monitor for safety. 

## 2023-02-10 NOTE — ED Notes (Signed)
Pt is in the bed sleeping. Respirations are even and unlabored. No acute distress noted. Will continue to monitor for safety. 

## 2023-02-10 NOTE — BH IP Treatment Plan (Signed)
Interdisciplinary Treatment and Diagnostic Plan Update  02/10/2023 Time of Session: [place] MRN: [place]:[id number]  Diagnosis:  Final diagnoses:  Alcohol abuse  Recurrent major depressive disorder, remission status unspecified ([place])  Behavior concern     Current Medications:  Current Facility-Administered Medications  Medication Dose Route Frequency Provider Last Rate Last Admin   acetaminophen (TYLENOL) tablet 650 mg  650 mg Oral Q6H PRN Evette Georges, NP       alum & mag hydroxide-simeth (MAALOX/MYLANTA) 200-200-20 MG/5ML suspension 30 mL  30 mL Oral Q4H PRN Evette Georges, NP       hydrOXYzine (ATARAX) tablet 25 mg  25 mg Oral Q6H PRN Evette Georges, NP       loperamide (IMODIUM) capsule 2-4 mg  2-4 mg Oral PRN Evette Georges, NP       LORazepam (ATIVAN) tablet 1 mg  1 mg Oral Q6H PRN Evette Georges, NP       LORazepam (ATIVAN) tablet 1 mg  1 mg Oral QID Evette Georges, NP   1 mg at 02/10/23 0920   Followed by   LORazepam (ATIVAN) tablet 1 mg  1 mg Oral TID Evette Georges, NP       Followed by   Derrill Memo ON 02/11/2023] LORazepam (ATIVAN) tablet 1 mg  1 mg Oral BID Evette Georges, NP       Followed by   Derrill Memo ON 02/13/2023] LORazepam (ATIVAN) tablet 1 mg  1 mg Oral Daily Evette Georges, NP       magnesium hydroxide (MILK OF MAGNESIA) suspension 30 mL  30 mL Oral Daily PRN Evette Georges, NP       multivitamin with minerals tablet 1 tablet  1 tablet Oral Daily Evette Georges, NP   1 tablet at 02/10/23 0920   OLANZapine zydis (ZYPREXA) disintegrating tablet 5 mg  5 mg Oral Q8H PRN Evette Georges, NP       And   ziprasidone (GEODON) injection 20 mg  20 mg Intramuscular PRN Evette Georges, NP       ondansetron (ZOFRAN-ODT) disintegrating tablet 4 mg  4 mg Oral Q6H PRN Evette Georges, NP       thiamine (VITAMIN B1) tablet 100 mg  100 mg Oral Daily Evette Georges, NP   100 mg at 02/10/23 0920   Current Outpatient Medications  Medication Sig Dispense Refill   famotidine (PEPCID) 20 MG  tablet Take 1 tablet (20 mg total) by mouth 2 (two) times daily. 30 tablet 0   escitalopram (LEXAPRO) 20 MG tablet Take 1 tablet (20 mg total) by mouth daily. (Patient not taking: Reported on 02/10/2023) 30 tablet 0   metoCLOPramide (REGLAN) 10 MG tablet Take 1 tablet (10 mg total) by mouth every 6 (six) hours. 10 tablet 0   naltrexone (DEPADE) 50 MG tablet Take 1 tablet (50 mg total) by mouth daily. 30 tablet 0   sucralfate (CARAFATE) 1 g tablet Take 1 tablet (1 g total) by mouth 4 (four) times daily -  with meals and at bedtime. 60 tablet 0   PTA Medications: Prior to Admission medications   Medication Sig Start Date End Date Taking? Authorizing Provider  famotidine (PEPCID) 20 MG tablet Take 1 tablet (20 mg total) by mouth 2 (two) times daily. 02/08/23  Yes Carlisle Cater, PA-C  escitalopram (LEXAPRO) 20 MG tablet Take 1 tablet (20 mg total) by mouth daily. Patient not taking: Reported on 02/10/2023 07/26/22   Merrily Brittle, DO  metoCLOPramide (REGLAN) 10 MG tablet Take 1 tablet (10  mg total) by mouth every 6 (six) hours. 02/08/23   Carlisle Cater, PA-C  naltrexone (DEPADE) 50 MG tablet Take 1 tablet (50 mg total) by mouth daily. 07/27/22 08/26/22  Merrily Brittle, DO  sucralfate (CARAFATE) 1 g tablet Take 1 tablet (1 g total) by mouth 4 (four) times daily -  with meals and at bedtime. 02/08/23   Carlisle Cater, PA-C    Patient Stressors: Financial difficulties   Marital or family conflict   Substance abuse    Patient Strengths: Ability for insight  Active sense of humor  Average or above average intelligence  Capable of independent living  Communication skills  General fund of knowledge  Motivation for treatment/growth  Special hobby/interest   Treatment Modalities: Medication Management, Group therapy, Case management,  1 to 1 session with clinician, Psychoeducation, Recreational therapy.   Physician Treatment Plan for Primary and Secondary Diagnosis:  Final diagnoses:  Alcohol abuse   Recurrent major depressive disorder, remission status unspecified ([place])  Behavior concern   Long Term Goal(s): Improvement in symptoms so as ready for discharge  Short Term Goals: Patient will verbalize feelings in meetings with treatment team members. Patient will attend at least of 50% of the groups daily. Pt will complete the PHQ9 on admission, day 3 and discharge. Patient will participate in completing the [place] Patient will score a low risk of violence for 24 hours prior to discharge  Medication Management: Evaluate patient's response, side effects, and tolerance of medication regimen.  Therapeutic Interventions: 1 to 1 sessions, Unit Group sessions and Medication administration.  Evaluation of Outcomes: Progressing  LCSW Treatment Plan for Primary Diagnosis:  Final diagnoses:  Alcohol abuse  Recurrent major depressive disorder, remission status unspecified ([place])  Behavior concern    Long Term Goal(s): Safe transition to appropriate next level of care at discharge.  Short Term Goals: Facilitate acceptance of mental health diagnosis and concerns through verbal commitment to aftercare plan and appointments at discharge., Patient will identify one social support prior to discharge to aid in patient's recovery., Patient will attend AA/NA groups as scheduled., Identify minimum of 2 triggers associated with mental health/substance abuse issues with treatment team members., and Increase skills for wellness and recovery by attending 50% of scheduled groups.  Therapeutic Interventions: Assess for all discharge needs, 1 to 1 time with Education officer, museum, Explore available resources and support systems, Assess for adequacy in community support network, Educate family and significant other(s) on suicide prevention, Complete Psychosocial Assessment, Interpersonal group therapy.  Evaluation of Outcomes: Progressing   Progress in Treatment: Attending groups:  Yes. Participating in groups: Yes. Taking medication as prescribed: Yes. Toleration medication: Yes. Family/Significant other contact made: No, will contact:  Patient's mother Sherle Poe [phone] Patient understands diagnosis: Yes. Discussing patient identified problems/goals with staff: Yes. Medical problems stabilized or resolved: Yes. Denies suicidal/homicidal ideation: Yes. Issues/concerns per patient self-inventory: Yes. Other: alcohol use and need for treatment  New problem(s) identified: No, Describe:  other than reported on admission  New Short Term/Long Term Goal(s): Safe transition to appropriate next level of care at discharge, Engage patient in therapeutic group addressing interpersonal concerns. Engage patient in aftercare planning with referrals and resources, Increase ability to appropriately verbalize feelings, Facilitate acceptance of mental health diagnosis and concerns and Identify triggers associated with mental health/substance abuse issues.   Patient Goals:  Patient is seeking residential placement at this time for substance use.   Discharge Plan or Barriers: LCSW will send referrals out for review for  residential placement. Updates will be provided as received.   Reason for Continuation of Hospitalization: Medication stabilization Withdrawal symptoms  Estimated Length of Stay: 3-5 days  Last 3 [place] Suicide Severity Risk Score: [place] ED from 02/09/2023 in [facility] from 02/08/2023 in [facility] Emergency Department at [facility] from 02/07/2023 in [facility] Emergency Department at [place] No Risk No Risk No Risk       Last [facility] 2/9 Scores:    07/27/2022    9:53 AM 07/25/2022    4:18 PM 07/23/2022   12:01 PM  Depression screen PHQ 2/9  Decreased Interest 0 0 0  Down, Depressed, Hopeless 0 0 0  PHQ - 2 Score 0 0 0  Altered sleeping 0 0 0  Tired, decreased energy 0  0 0  Change in appetite 0 0 0  Feeling bad or failure about yourself  0 0 2  Trouble concentrating 0 0 0  Moving slowly or fidgety/restless 0 0 0  Suicidal thoughts 0 0 0  PHQ-9 Score 0 0 2  Difficult doing work/chores   Somewhat difficult    Scribe for Treatment Team: Gigi Gin 02/10/2023 11:16 AM

## 2023-02-10 NOTE — ED Notes (Signed)
Patient is sleeping. Respirations equal and unlabored, skin warm and dry, NAD. No change in assessment or acuity. Routine safety checks conducted according to facility protocol. Will continue to monitor for safety.   

## 2023-02-10 NOTE — Tx Team (Signed)
Victor Stanley is a 22 y/o male with a history of alcohol abuse, depression, presented to [facility] voluntarily accompanied by his mother Sherle Poe [phone].  Per admissions note, "patient wanted to detox from alcohol due to being sick since Saturday. Patient reports he had 2-3 ER visits and was diagnosed with alcohol gastritis.  Patient repots an attempt at detoxing last year, however reports relapsing after a few days. Per patient, he has been drinking daily for the last year. Patient reports being unsure of the amount of alcohol he drinks stating "I drink a lot". Patient also reports a recent use of smoking marijuana. Patient denies any other substance use. Patient reports he currently is not linked to any outpatient providers".   Patient informed treatment team that he presented to the [facility] in order to detox from his alcohol use. Patient reports he has been drinking between 2-4 40oz beers daily for the last couple of months. Patient reports he has been struggling with alcoholism since high school reporting it became a habit and hobby while being around friends and partying all the time. Patient reports a typically day for him starts with a shower and then drinking alcohol all day. Patient reports when he is bored with the alcohol then he would occasionally smoke marijuana. Patient reports he lives at home with his mother who also struggles with alcohol use. Patient reports having a good relationship with his mother, however, reports when they both have been drinking it is not well. Patient reports a prior history of depression and self-harm via cutting. Patient reports he has not cut since high school and appears to have superficial cuts on his arm. Patient denies ever having any prior MH admissions and denies ever seeking inpatient treatment for substance use. Patient reports he has attending [facility] meetings in the past and reports being seen here at the [facility] back in Dec 2023. Patient reports his current  goal is to seek residential placement for himself. Patient reports his driving force is being able to make a change in his life, obtain a job, get his GED, and hopefully encourage his mother to stop her alcohol use. Patient reports he is currently unemployed and denies receiving any additional income. Patient denies any pending charges or upcoming court dates. Patient denied any safety concerns within the home and has provided permission for LCSW to follow up with his mother. Patient aware that LCSW will send referrals out for review and will follow up to provide updates as received. Patient expressed understanding and appreciation for LCSW assistance. No other needs were reported at this time.   Patient has been referred to [facility], [facility], and [facility].   Lucius Conn, LCSW Clinical Social Worker [facility] Ph: [phone]
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
## 2023-02-10 NOTE — ED Notes (Signed)
Patient A&Ox4. Denies intent to harm self/others when asked. Denies A/VH. Patient denies any physical complaints when asked. No acute distress noted. Pt states, "I came back in here because I slipped up. I want to stop using all together. The best decision I made was to come back". Support and encouragement provided. Routine safety checks conducted according to facility protocol. Encouraged patient to notify staff if thoughts of harm toward self or others arise. Patient verbalize understanding and agreement. Will continue to monitor for safety.

## 2023-02-10 NOTE — ED Notes (Signed)
Attended nutrition group.

## 2023-02-10 NOTE — Group Note (Signed)
Group Topic: Understanding Self  Group Date: 02/10/2023 Start Time: 0745 End Time: 0830 Facilitators: Mervyn Skeeters D, NT  Department: [facility]  Number of Participants: 7  Group Focus: individual meeting, relaxation, self-awareness, and social skills Treatment Modality:  Psychoeducation Interventions utilized were leisure development, mental fitness, patient education, and support Purpose: enhance coping skills, express feelings, improve communication skills, increase insight, and reinforce self-care  Name: Victor Stanley Date of Birth: 2001-07-31  MR: [id number]    Level of Participation: moderate Quality of Participation: attentive, cooperative, engaged, and motivated Interactions with others: gave feedback Mood/Affect: appropriate Triggers (if applicable): n/a Cognition: coherent/clear and concrete Progress: Moderate Response: n/a Plan: follow-up needed  Patients Problems:  Patient Active Problem List   Diagnosis Date Noted   Alcohol abuse 02/09/2023   Suicidal ideation 07/26/2022   Alcohol use disorder 07/26/2022   History of non-suicidal self-harm 07/26/2022   H/O suicide attempt 07/26/2022   MDD (major depressive disorder), recurrent severe, 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

## 2023-02-11 DIAGNOSIS — F331 Major depressive disorder, recurrent, moderate: Secondary | ICD-10-CM | POA: Diagnosis not present

## 2023-02-11 DIAGNOSIS — Z1152 Encounter for screening for COVID-19: Secondary | ICD-10-CM | POA: Diagnosis not present

## 2023-02-11 DIAGNOSIS — F1020 Alcohol dependence, uncomplicated: Secondary | ICD-10-CM | POA: Diagnosis not present
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
## 2023-02-11 NOTE — ED Notes (Signed)
Pt encouraged to get up and attend chaplin's group

## 2023-02-11 NOTE — ED Notes (Signed)
Pt in room a/o and awake. Denies Si/HI/AVH. Denies s/s of withdrawal. No noted distress. Will continue to monitor for safety

## 2023-02-11 NOTE — ED Notes (Signed)
Pt is in the bed sleeping. Respirations are even and unlabored. No acute distress noted. Will continue to monitor for safety. 

## 2023-02-11 NOTE — ED Notes (Signed)
SPIRITUALITY GROUP NOTE  Spirituality group facilitated by Simone Curia, MDiv, [place].  Group Description: Group focused on topic of hope. Patients participated in facilitated discussion around topic, connecting with one another around experiences and definitions for hope. Group members engaged with visual explorer photos, reflecting on what hope looks like for them today. Group engaged in discussion around how their definitions of hope are present today in hospital.  Modalities: Psycho-social ed, Adlerian, Narrative, MI  Patient Progress: Present throughout group.  Alert and active in group discussion. Expressed some resonance with the concept of "growth" and "learning" as a part of hope and noted that "hope is the strength to continue to grow"

## 2023-02-11 NOTE — ED Notes (Signed)
Pt is awake and alert.  Eating breakfast this morning without difficulty.

## 2023-02-11 NOTE — Discharge Planning (Signed)
LCSW followed up with Admissions Coordinator Enis Gash at [facility] regarding referral. Patient demographics provided for insurance verification purposes. Per Enis Gash, patient is cleared via insurance requirement. Currently awaiting update from RN regarding referral. Once update has been provided, Enis Gash will follow up to inform. Patient made aware of process. No other needs to report at this time.   Lucius Conn, LCSW Clinical Social Worker [facility] Ph: [phone]
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
## 2023-02-11 NOTE — ED Notes (Signed)
Pt awake and ate lunch.  Denies complaints at this time.  Staff will cont to monitor for safety.

## 2023-02-11 NOTE — Discharge Instructions (Addendum)
[facility] [address] [place], [place], [address] [phone] phone  New Patient Assessment/Therapy Walk-Ins:  Monday and Wednesday: 8 am until slots are full. Every 1st and 2nd Fridays of the month: 1 pm - 5 pm.  NO ASSESSMENT/THERAPY WALK-INS ON TUESDAYS OR THURSDAYS  New Patient Assessment/Medication Management Walk-Ins:  Monday - Friday:  8 am - 11 am.  For all walk-ins, we ask that you arrive by 7:30 am because patients will be seen in the order of arrival.  Availability is limited; therefore, you may not be seen on the same day that you walk-in.  Our goal is to serve and meet the needs of our community to the best of our ability.  SUBSTANCE USE TREATMENT for Medicaid and State Funded/IPRS  [facility] [address] [place], [place], [address] [phone] phone NOTE: [facility] is no longer offering IOP services.  Serves those who are low-income or have no insurance.  [facility] [address], [place], [place], [address] [phone] phone [phone] fax NOTE: Does have Substance Abuse-Intensive Outpatient Program (SAIOP) as well as transitional housing if eligible.  [facility] [address] [place], [place], [address] [phone] phone [phone] fax  [facility] [address] [place], [place], [address] [phone] phone [phone] fax  HALFWAY HOUSES:  [facility] [phone]  [facility] www.oxfordvacancies.com  12 STEP PROGRAMS:  Alcoholics Anonymous of [place] https://aagreensboronc.com/meeting  Narcotics Anonymous of [place] https://greensborona.org/meetings/  Al-Anon of [place], [place] www.greens[place]alanon.org/find-meetings.html  Nar-Anon https://nar-anon.org/find-a-meetin  List of Residential placements:   [facility]: [phone]  [facility]: [place], [place] [phone]: Male and male facility; 30-day program:  (uninsured and Medicaid such as [facility], [facility], [facility], partners)  [facility]: [phone]; men and women's facility; 28 days; Can have Medicaid tailored plan ([facility] or [facility])  [facility]: [phone] Ang[place] or [place]; 28 day program; must be fully detox; tailored Medicaid or no insurance  [facility] in [place], [place]; [phone]; 28 day all males program; no insurance accepted  [facility] in [place]: [place] [phone] (no insurance or Medicaid only); 90 days; outpatient services but provide housing in apartments [place]  [facility] Admission: [phone]: Patient must complete phone screening for placement: [place], [place]; 6 month program; uninsured, Medicaid, and [facility] insurance.   [facility]: no insurance required; [phone]  [facility]: [phone]; Intake: Robert; Must fill out application online; Victor Delay [phone] x [phone]  [facility] in [place], [place]: [phone]; Admissions Coordinators Mr. Dennis or David Gibson; 90 day program.  [facility]: [place], [place] [phone]; Co-Ed 9 month to a year program; Online application; Men entry fee is $500 (6-12months);  [facility]: [address] , [place] [address]; no fee or insurance required; minimum of 2 years; Highly structured; work based; Intake Coordinator is Chris [phone]  [facility] in [place], [place]: [phone]; Fax number is [phone]; website: www.[facility].org; Requires 3-6 page autobiography; 2 year program (18 months and then 6month transitional housing); Admission fee is $300; no insurance needed; work program  [facility] in [place], : Front Desk Staff: Reeci [phone]: They have a Men's Regenerations Program 6-9months. Free program; There is an initial $300 fee however, they are willing to work with patients regarding that. Application is  online.  [facility]: Admissions [phone] Benjamin Cox ext [phone]; Any 7-90 day program is out of pocket; 12 month program is free of charge; there is   a $275 entry fee; Patient is responsible for own transportation  Patient is instructed prior to discharge to:  Take all medications as prescribed by his/her mental healthcare provider. Report any adverse effects and or reactions from the medicines to his/her outpatient provider promptly. Keep all scheduled appointments, to ensure that you are getting refills on time and to avoid any interruption in your medication.  If you are unable to keep an appointment call to reschedule.  Be sure to follow-up with resources and follow-up appointments provided.  Patient has been instructed & cautioned: To not engage in alcohol and or illegal drug use while on prescription medicines. In the event of worsening symptoms, patient is instructed to call the crisis hotline, 911 and or go to the nearest ED for appropriate evaluation and treatment of symptoms. To follow-up with his/her primary care provider for your other medical issues, concerns and or health care needs.    

## 2023-02-11 NOTE — Group Note (Signed)
Group Topic: Relapse and Recovery  Group Date: 02/11/2023 Start Time: 0800 End Time: 0830 Facilitators: Mervyn Skeeters D, NT  Department: [facility]  Number of Participants: 8  Group Focus: activities of daily living skills, communication, coping skills, individual meeting, and relapse prevention Treatment Modality:  Psychoeducation Interventions utilized were patient education Purpose: enhance coping skills, express feelings, improve communication skills, and relapse prevention strategies  Name: Victor Stanley Date of Birth: December 15, 2000  MR: [id number]    Level of Participation: moderate Quality of Participation: attentive Interactions with others: gave feedback Mood/Affect: appropriate Triggers (if applicable): n/a Cognition: coherent/clear Progress: Moderate Response: n/a Plan: follow-up needed  Patients Problems:  Patient Active Problem List   Diagnosis Date Noted   Alcohol abuse 02/09/2023   Suicidal ideation 07/26/2022   Alcohol use disorder 07/26/2022   History of non-suicidal self-harm 07/26/2022   H/O suicide attempt 07/26/2022   MDD (major depressive disorder), recurrent severe, 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

## 2023-02-12 DIAGNOSIS — F331 Major depressive disorder, recurrent, moderate: Secondary | ICD-10-CM | POA: Diagnosis not present

## 2023-02-12 DIAGNOSIS — Z1152 Encounter for screening for COVID-19: Secondary | ICD-10-CM | POA: Diagnosis not present

## 2023-02-12 DIAGNOSIS — F1020 Alcohol dependence, uncomplicated: Secondary | ICD-10-CM | POA: Diagnosis not present

## 2023-02-12 MED ORDER — HYDROXYZINE HCL 25 MG PO TABS: 25.0000 mg | ORAL_TABLET | Freq: Four times a day (QID) | ORAL | 0 refills | Status: AC | PRN

## 2023-02-12 MED ORDER — ESCITALOPRAM OXALATE 5 MG PO TABS: 5.0000 mg | ORAL_TABLET | Freq: Every day | ORAL | 0 refills | Status: DC

## 2023-02-12 MED ORDER — TRIAMCINOLONE ACETONIDE 0.1 % EX CREA: TOPICAL_CREAM | Freq: Two times a day (BID) | CUTANEOUS | 0 refills | Status: AC

## 2023-02-12 MED ORDER — NYSTATIN 100000 UNIT/GM EX CREA: TOPICAL_CREAM | Freq: Two times a day (BID) | CUTANEOUS | 0 refills | Status: AC

## 2023-02-12 NOTE — ED Notes (Signed)
Patient accepted scheduled meds w/o difficulty. Out of bed today room for breakfast. Currently resting in assigned room in no observed distress. Safety maintained and will continue to monitor.

## 2023-02-12 NOTE — ED Provider Notes (Signed)
FBC/OBS ASAP Discharge Summary  Date and Time: 02/12/2023 10:42 AM  Name: Victor Stanley  MRN:  [id number]   Discharge Diagnoses:  Final diagnoses:  Alcohol abuse  HPI: Victor Stanley 22 year old male who initially presented to the emergency department for alcohol use disorder.  He was recommended for admission to the facility base crisis unit and was admitted onto the unit on 02/09/2022.  Subjective:   Victor Stanley, 22 y.o., male patient seen face to face by this provider, consulted with Dr. Alroy Dust; and chart reviewed on 02/12/23.  Per chart review patient has no documented behavioral health history.  Upon admission EtOH is negative.  UDS is positive for cocaine and marijuana.  During evaluation Victor Stanley is observed sitting on the unit talking with another patient.  He has a bright affect and is pleasant upon approach.  He is alert/oriented x 4, cooperative, and attentive.  He has normal speech and behavior.  He is denying any concerns with appetite or sleep.  He is requesting to be discharged.  He states he has had time to think about it and he does not want to participate in a 28-day program.  He is interested in outpatient services.  Discussed CD IOP program with patient and he is in agreement and is requesting that a referral be made.  He feels motivated that he can maintain his sobriety.  Discussed remaining on the unit to continue Ativan taper and patient is declining.  Discussed that he would be leaving [place] and he continues to request to be discharged.  He has remained cooperative and appropriate while on the unit.  He has been compliant with medications.  He has required no as needed medications for agitation.  He continues to deny SI/HI/AVH.  He verbally contracts for safety. Objectively there is no evidence of psychosis/mania or delusional thinking.  Patient is able to converse coherently, goal directed thoughts, no  distractibility, or pre-occupation. Patient answered question appropriately.     Stay Summary:   Victor Stanley was admitted to the facility base crisis unit for alcohol detox.  He was treated with an Ativan taper, Lexapro 5 mg daily for depression and hydroxyzine 25 mg 3 times daily as needed.  Medications were tolerated with no adverse reactions.  Victor Stanley was discharged with current medication and was instructed on how to take medications as prescribed.  Improvement was monitored by observation and Victor Stanley report of symptom reduction.  In addition his emotional and mental status was also monitored by staff.          Victor Stanley was evaluated for stability and plans for continued recovery upon discharge.   Employment, transportation, bed availability, health status, family support, and any pending legal issues were also considered during his admission.  He was offered further treatment options upon discharge including but not limited to Residential, Intensive Outpatient, and outpatient treatment. Victor Stanley will follow up with the services as listed below under Follow up Information.     Upon completion of this admission the Victor Stanley was both mentally and medically stable for discharge denying suicidal/homicidal ideation, auditory/visual/tactile hallucinations, delusional thoughts and paranoia.      Total Time spent with patient: 30 minutes  Past Psychiatric History: As documented in H&P Past Medical History: As documented in H&P Family History: As documented in H&P Family Psychiatric History: As documented in H&P Social History: As documented in H&P Tobacco Cessation:  N/A, patient does  not currently use tobacco products  Current Medications:  Current Facility-Administered Medications  Medication Dose Route Frequency Provider Last Rate Last Admin   acetaminophen (TYLENOL) tablet 650 mg  650 mg Oral Q6H  PRN Evette Georges, NP       alum & mag hydroxide-simeth (MAALOX/MYLANTA) 200-200-20 MG/5ML suspension 30 mL  30 mL Oral Q4H PRN Evette Georges, NP       escitalopram (LEXAPRO) tablet 5 mg  5 mg Oral Daily Corky Sox, MD   5 mg at 02/12/23 [id number]   hydrOXYzine (ATARAX) tablet 25 mg  25 mg Oral Q6H PRN Evette Georges, NP   25 mg at 02/11/23 2113   loperamide (IMODIUM) capsule 2-4 mg  2-4 mg Oral PRN Evette Georges, NP       LORazepam (ATIVAN) tablet 1 mg  1 mg Oral Q6H PRN Evette Georges, NP   1 mg at 02/11/23 2127   LORazepam (ATIVAN) tablet 1 mg  1 mg Oral BID Evette Georges, NP   1 mg at 02/12/23 [id number]   Followed by   Derrill Memo ON 02/13/2023] LORazepam (ATIVAN) tablet 1 mg  1 mg Oral Daily Evette Georges, NP       magnesium hydroxide (MILK OF MAGNESIA) suspension 30 mL  30 mL Oral Daily PRN Evette Georges, NP       multivitamin with minerals tablet 1 tablet  1 tablet Oral Daily Evette Georges, NP   1 tablet at 02/12/23 [id number]   nystatin cream (MYCOSTATIN)   Topical BID Corky Sox, MD   Given at 02/12/23 [phone]   And   triamcinolone cream (KENALOG) 0.1 % cream   Topical BID Corky Sox, MD   Given at 02/12/23 0937   OLANZapine zydis (ZYPREXA) disintegrating tablet 5 mg  5 mg Oral Q8H PRN Evette Georges, NP       And   ziprasidone (GEODON) injection 20 mg  20 mg Intramuscular PRN Evette Georges, NP       ondansetron (ZOFRAN-ODT) disintegrating tablet 4 mg  4 mg Oral Q6H PRN Evette Georges, NP       thiamine (VITAMIN B1) tablet 100 mg  100 mg Oral Daily Evette Georges, NP   100 mg at 02/12/23 [id number]   Current Outpatient Medications  Medication Sig Dispense Refill   famotidine (PEPCID) 20 MG tablet Take 1 tablet (20 mg total) by mouth 2 (two) times daily. 30 tablet 0   escitalopram (LEXAPRO) 20 MG tablet Take 1 tablet (20 mg total) by mouth daily. (Patient not taking: Reported on 02/10/2023) 30 tablet 0   metoCLOPramide (REGLAN) 10 MG tablet Take 1 tablet (10 mg total) by mouth every 6 (six) hours. (Patient  not taking: Reported on 02/10/2023) 10 tablet 0   sucralfate (CARAFATE) 1 g tablet Take 1 tablet (1 g total) by mouth 4 (four) times daily -  with meals and at bedtime. (Patient not taking: Reported on 02/10/2023) 60 tablet 0    PTA Medications:  Facility Ordered Medications  Medication   acetaminophen (TYLENOL) tablet 650 mg   alum & mag hydroxide-simeth (MAALOX/MYLANTA) 200-200-20 MG/5ML suspension 30 mL   magnesium hydroxide (MILK OF MAGNESIA) suspension 30 mL   [COMPLETED] thiamine (VITAMIN B1) injection 100 mg   thiamine (VITAMIN B1) tablet 100 mg   multivitamin with minerals tablet 1 tablet   LORazepam (ATIVAN) tablet 1 mg   hydrOXYzine (ATARAX) tablet 25 mg   loperamide (IMODIUM) capsule 2-4 mg   ondansetron (ZOFRAN-ODT) disintegrating tablet 4 mg   [EXPIRED] LORazepam (ATIVAN) tablet  1 mg   Followed by   [COMPLETED] LORazepam (ATIVAN) tablet 1 mg   Followed by   LORazepam (ATIVAN) tablet 1 mg   Followed by   Derrill Memo ON 02/13/2023] LORazepam (ATIVAN) tablet 1 mg   OLANZapine zydis (ZYPREXA) disintegrating tablet 5 mg   And   ziprasidone (GEODON) injection 20 mg   nystatin cream (MYCOSTATIN)   And   triamcinolone cream (KENALOG) 0.1 % cream   escitalopram (LEXAPRO) tablet 5 mg   [COMPLETED] potassium chloride (KLOR-CON M) CR tablet 10 mEq   PTA Medications  Medication Sig   famotidine (PEPCID) 20 MG tablet Take 1 tablet (20 mg total) by mouth 2 (two) times daily.   escitalopram (LEXAPRO) 20 MG tablet Take 1 tablet (20 mg total) by mouth daily. (Patient not taking: Reported on 02/10/2023)   sucralfate (CARAFATE) 1 g tablet Take 1 tablet (1 g total) by mouth 4 (four) times daily -  with meals and at bedtime. (Patient not taking: Reported on 02/10/2023)   metoCLOPramide (REGLAN) 10 MG tablet Take 1 tablet (10 mg total) by mouth every 6 (six) hours. (Patient not taking: Reported on 02/10/2023)       02/10/2023    4:28 PM 07/27/2022    9:53 AM 07/25/2022    4:18 PM  Depression  screen PHQ 2/9  Decreased Interest 1 0 0  Down, Depressed, Hopeless 1 0 0  PHQ - 2 Score 2 0 0  Altered sleeping 0 0 0  Tired, decreased energy 1 0 0  Change in appetite 1 0 0  Feeling bad or failure about yourself  0 0 0  Trouble concentrating 1 0 0  Moving slowly or fidgety/restless 0 0 0  Suicidal thoughts 0 0 0  PHQ-9 Score 5 0 0  Difficult doing work/chores Not difficult at all      [place] ED from 02/09/2023 in [facility] ED from 02/08/2023 in [facility] Emergency Department at [facility] ED from 02/07/2023 in [facility] Emergency Department at [place] No Risk No Risk No Risk       Musculoskeletal  Strength & Muscle Tone: within normal limits Gait & Station: normal Patient leans: N/A  Psychiatric Specialty Exam  Presentation  General Appearance:  Appropriate for Environment; Casual  Eye Contact: Good  Speech: Clear and Coherent; Normal Rate  Speech Volume: Normal  Handedness: Right   Mood and Affect  Mood: Euthymic  Affect: Congruent   Thought Process  Thought Processes: Coherent  Descriptions of Associations:Intact  Orientation:Full (Time, Place and Person)  Thought Content:Logical  Diagnosis of Schizophrenia or Schizoaffective disorder in past: No    Hallucinations:Hallucinations: None  Ideas of Reference:None  Suicidal Thoughts:Suicidal Thoughts: No  Homicidal Thoughts:Homicidal Thoughts: No   Sensorium  Memory: Immediate Good; Remote Good; Recent Good  Judgment: Fair  Insight: Fair   Community education officer  Concentration: Good  Attention Span: Good  Recall: Good  Fund of Knowledge: Good  Language: Good   Psychomotor Activity  Psychomotor Activity: Psychomotor Activity: Normal   Assets  Assets: Communication Skills; Desire for Improvement; Financial Resources/Insurance; Social Support; Resilience; Physical Health;  Housing   Sleep  Sleep: Sleep: Good   No data recorded  Physical Exam  Physical Exam Vitals and nursing note reviewed.  Constitutional:      General: He is not in acute distress.    Appearance: Normal appearance. He is well-developed.  HENT:     Head: Normocephalic  and atraumatic.  Eyes:     General:        Right eye: No discharge.        Left eye: No discharge.  Cardiovascular:     Rate and Rhythm: Normal rate.     Heart sounds: No murmur heard. Pulmonary:     Effort: Pulmonary effort is normal. No respiratory distress.  Musculoskeletal:        General: Normal range of motion.     Cervical back: Normal range of motion.  Neurological:     Mental Status: He is alert and oriented to person, place, and time.  Psychiatric:        Attention and Perception: Attention and perception normal.        Mood and Affect: Mood and affect normal.        Speech: Speech normal.        Behavior: Behavior normal. Behavior is cooperative.        Thought Content: Thought content normal.        Cognition and Memory: Cognition normal.        Judgment: Judgment normal.    Review of Systems  Constitutional: Negative.   HENT: Negative.    Eyes: Negative.   Respiratory: Negative.    Cardiovascular: Negative.   Musculoskeletal: Negative.   Skin: Negative.   Neurological: Negative.   Endo/Heme/Allergies: Negative.   Psychiatric/Behavioral: Negative.     Blood pressure 124/84, pulse 77, temperature 98.3 F (36.8 C), temperature source Oral, resp. rate 18, SpO2 100 %. There is no height or weight on file to calculate BMI.  Demographic Factors:  Male, Adolescent or young adult, and Unemployed  Loss Factors: Financial problems/change in socioeconomic status  Historical Factors: Impulsivity  Risk Reduction Factors:   Sense of responsibility to family, Religious beliefs about death, Living with another person, especially a relative, Positive social support, Positive therapeutic  relationship, and Positive coping skills or problem solving skills  Continued Clinical Symptoms:  Alcohol/Substance Abuse/Dependencies  Cognitive Features That Contribute To Risk:  None    Suicide Risk:  Minimal: No identifiable suicidal ideation.  Patients presenting with no risk factors but with morbid ruminations; may be classified as minimal risk based on the severity of the depressive symptoms  Plan Of Care/Follow-up recommendations:  Activity:  as tolerated  Diet:  regular  Disposition:   Discharge patient, at patient's request.  Provided outpatient substance abuse resources for medication management, therapy, and CD IOP.  Referral made to [facility] CD IOP program.  Printed prescriptions for hydroxyzine 25 mg 3 times daily as needed, Lexapro 5 mg daily provided.  Patient was given nystatin cream and triamcinolone cream upon discharge.   Revonda Humphrey, NP 02/12/2023, 10:42 AM

## 2023-02-12 NOTE — ED Notes (Signed)
Discharge instructions provided and Pt stated understanding. Pt alert, orient and ambulatory prior to d/c from facility. Personal belongings returned from locker number 11. [employer] called to provide transportation services to home.Safety maintained.

## 2023-02-12 NOTE — ED Notes (Signed)
Pt is sleeping. No distress noted. Will continue to monitor for safety. 

## 2023-02-12 NOTE — ED Notes (Signed)
Pt was given breakfast

## 2023-02-12 NOTE — ED Notes (Signed)
Pt sleeping in room. RR even and unlabored. No noted distress. Will continue to monitor for safety 

## 2023-05-07 ENCOUNTER — Encounter (HOSPITAL_COMMUNITY): Payer: Self-pay | Admitting: Emergency Medicine

## 2023-05-07 ENCOUNTER — Emergency Department (HOSPITAL_COMMUNITY): Payer: Medicaid Other

## 2023-05-07 ENCOUNTER — Other Ambulatory Visit: Payer: Self-pay

## 2023-05-07 ENCOUNTER — Emergency Department (HOSPITAL_COMMUNITY)
Admission: EM | Admit: 2023-05-07 | Discharge: 2023-05-07 | Payer: Medicaid Other | Attending: Emergency Medicine | Admitting: Emergency Medicine

## 2023-05-07 DIAGNOSIS — R079 Chest pain, unspecified: Secondary | ICD-10-CM

## 2023-05-07 DIAGNOSIS — J45909 Unspecified asthma, uncomplicated: Secondary | ICD-10-CM | POA: Insufficient documentation

## 2023-05-07 DIAGNOSIS — E876 Hypokalemia: Secondary | ICD-10-CM | POA: Diagnosis not present

## 2023-05-07 LAB — CBC
HCT: 39.7 % (ref 39.0–52.0)
Hemoglobin: 13.2 g/dL (ref 13.0–17.0)
MCH: 28.9 pg (ref 26.0–34.0)
MCHC: 33.2 g/dL (ref 30.0–36.0)
MCV: 87.1 fL (ref 80.0–100.0)
Platelets: 417 10*3/uL — ABNORMAL HIGH (ref 150–400)
RBC: 4.56 MIL/uL (ref 4.22–5.81)
RDW: 13.8 % (ref 11.5–15.5)
WBC: 11.6 10*3/uL — ABNORMAL HIGH (ref 4.0–10.5)
nRBC: 0 % (ref 0.0–0.2)

## 2023-05-07 LAB — RAPID URINE DRUG SCREEN, HOSP PERFORMED
Amphetamines: NOT DETECTED
Barbiturates: NOT DETECTED
Benzodiazepines: POSITIVE — AB
Cocaine: NOT DETECTED
Opiates: NOT DETECTED
Tetrahydrocannabinol: POSITIVE — AB

## 2023-05-07 LAB — HEPATIC FUNCTION PANEL
ALT: 21 U/L (ref 0–44)
AST: 28 U/L (ref 15–41)
Albumin: 3.6 g/dL (ref 3.5–5.0)
Alkaline Phosphatase: 48 U/L (ref 38–126)
Bilirubin, Direct: 0.2 mg/dL (ref 0.0–0.2)
Indirect Bilirubin: 0.5 mg/dL (ref 0.3–0.9)
Total Bilirubin: 0.7 mg/dL (ref 0.3–1.2)
Total Protein: 6.4 g/dL — ABNORMAL LOW (ref 6.5–8.1)

## 2023-05-07 LAB — BASIC METABOLIC PANEL
Anion gap: 11 (ref 5–15)
BUN: 5 mg/dL — ABNORMAL LOW (ref 6–20)
CO2: 21 mmol/L — ABNORMAL LOW (ref 22–32)
Calcium: 8.5 mg/dL — ABNORMAL LOW (ref 8.9–10.3)
Chloride: 104 mmol/L (ref 98–111)
Creatinine, Ser: 0.84 mg/dL (ref 0.61–1.24)
GFR, Estimated: >60 mL/min (ref >60)
Glucose, Bld: 167 mg/dL — ABNORMAL HIGH (ref 70–99)
Potassium: 3.3 mmol/L — ABNORMAL LOW (ref 3.5–5.1)
Sodium: 136 mmol/L (ref 135–145)

## 2023-05-07 LAB — TROPONIN I (HIGH SENSITIVITY)
Troponin I (High Sensitivity): 3 ng/L (ref <18)
Troponin I (High Sensitivity): 4 ng/L (ref <18)

## 2023-05-07 LAB — LIPASE, BLOOD: Lipase: 35 U/L (ref 11–51)

## 2023-05-07 LAB — ETHANOL: Alcohol, Ethyl (B): <10 mg/dL (ref <10)

## 2023-05-07 LAB — SALICYLATE LEVEL: Salicylate Lvl: <7 mg/dL — ABNORMAL LOW (ref 7.0–30.0)

## 2023-05-07 LAB — CBG MONITORING, ED: Glucose-Capillary: 184 mg/dL — ABNORMAL HIGH (ref 70–99)

## 2023-05-07 LAB — ACETAMINOPHEN LEVEL: Acetaminophen (Tylenol), Serum: <10 ug/mL — ABNORMAL LOW (ref 10–30)

## 2023-05-07 MED ORDER — IOHEXOL 350 MG/ML SOLN
75.0000 mL | Freq: Once | INTRAVENOUS | Status: AC | PRN
  Administered 2023-05-07: 75 mL via INTRAVENOUS

## 2023-05-07 MED ORDER — MAGNESIUM SULFATE 2 GM/50ML IV SOLN
2.0000 g | Freq: Once | INTRAVENOUS | Status: AC
  Administered 2023-05-07: 2 g via INTRAVENOUS
  Filled 2023-05-07: qty 50

## 2023-05-07 MED ORDER — PANTOPRAZOLE 80MG IVPB - SIMPLE MED
80.0000 mg | Freq: Once | INTRAVENOUS | Status: AC
  Administered 2023-05-07: 80 mg via INTRAVENOUS
  Filled 2023-05-07: qty 100

## 2023-05-07 MED ORDER — LACTATED RINGERS IV BOLUS
1000.0000 mL | Freq: Once | INTRAVENOUS | Status: AC
  Administered 2023-05-07: 1000 mL via INTRAVENOUS

## 2023-05-07 MED ORDER — ONDANSETRON HCL 4 MG/2ML IJ SOLN: 4.0000 mg | Freq: Once | INTRAMUSCULAR | Status: DC | PRN

## 2023-05-07 MED ORDER — NITROGLYCERIN 0.4 MG SL SUBL
0.4000 mg | SUBLINGUAL_TABLET | SUBLINGUAL | Status: DC | PRN
  Administered 2023-05-07: 0.4 mg via SUBLINGUAL
  Filled 2023-05-07: qty 1

## 2023-05-07 MED ORDER — ONDANSETRON HCL 4 MG/2ML IJ SOLN
4.0000 mg | Freq: Once | INTRAMUSCULAR | Status: AC
  Administered 2023-05-07: 4 mg via INTRAVENOUS
  Filled 2023-05-07: qty 2

## 2023-05-07 MED ORDER — POTASSIUM CHLORIDE 10 MEQ/100ML IV SOLN
10.0000 meq | Freq: Once | INTRAVENOUS | Status: AC
  Administered 2023-05-07: 10 meq via INTRAVENOUS
  Filled 2023-05-07: qty 100

## 2023-05-07 NOTE — ED Provider Triage Note (Signed)
Emergency Medicine Provider Triage Evaluation Note  Victor Stanley , a 22 y.o. male  was evaluated in triage.  Pt complains of left-sided chest pain associated with shortness of breath.  Reports symptoms began around 12 PM today.  He is currently incarcerated in jail.  He denies ingesting any substances.  The patient is extremely diaphoretic on examination.  He is seemingly somnolent.  He denies any radiation of his chest pain.  He denies lightheadedness, dizziness.  He is endorsing weakness.  Called Dr. Jacqulyn Bath, attending, to bedside who evaluated patient.  We have put in orders.  Review of Systems  Positive:  Negative:   Physical Exam  BP 120/72 (BP Location: Right Arm) Comment: Simultaneous filing. User may not have seen previous data. Comment (BP Location): Simultaneous filing. User may not have seen previous data.  Pulse 60 Comment: Simultaneous filing. User may not have seen previous data.  Temp 97.6 F (36.4 C) (Oral) Comment: Simultaneous filing. User may not have seen previous data. Comment (Src): Simultaneous filing. User may not have seen previous data.  Resp 20 Comment: Simultaneous filing. User may not have seen previous data.  Ht 5\' 11"  (1.803 m)   Wt 73 kg   SpO2 99% Comment: Simultaneous filing. User may not have seen previous data.  BMI 22.45 kg/m  Gen:   Awake, no distress   Resp:  Normal effort  MSK:   Moves extremities without difficulty  Other:    Medical Decision Making  Medically screening exam initiated at 2:38 PM.  Appropriate orders placed.  Victor Stanley was informed that the remainder of the evaluation will be completed by another provider, this initial triage assessment does not replace that evaluation, and the importance of remaining in the ED until their evaluation is complete.     Victor Decant, PA-C 05/07/23 1439

## 2023-05-07 NOTE — ED Triage Notes (Signed)
Pt BIB [facility] from [facility] due to left sided chest pain that does not radiate anywhere.  Pt reports it started around 1230 noon today 05/07/23.  Pt has been diaphoretic and has vomited 3 times.  20g left hand.  NS given en route.  324 aspirin given at [facility]  VS BP 116/70, CBG 174, HR 60 SpO2 80% RA.  Currently on 6L nasal cannula.

## 2023-05-07 NOTE — ED Provider Notes (Signed)
[place] EMERGENCY DEPARTMENT AT [name] Provider Note   HPI: Victor Stanley is a 22 year old male with a past medical history as below presenting today with reported chest pain.  The patient is presenting from jail.  He reported that his chest pain started around 1230 noon today, he has been diaphoretic, and has vomited 3 times.  He reports his chest pain has since resolved.  Additionally, he has a new oxygen requirement.  He denies any substance use but endorses taking his scheduled antidepressant medications.  Denies taking any extra of his medication.  At present he feels nauseous but does not have any chest pain, shortness of breath.  He says he feels sleepy because he has not been getting much sleep lately.  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 No pertinent surgical history.   Social History   Tobacco Use   Smoking status: Never    Passive exposure: Yes  Substance Use Topics   Alcohol use: Yes    Comment: binge drinking   Drug use: Yes    Types: Marijuana      Review of Systems  A complete ROS was performed with pertinent positives/negatives noted in the HPI.   Vitals:   05/07/23 1515 05/07/23 1530  BP: (!) 113/58 (!) 137/50  Pulse: 72 65  Resp: 19 15  Temp:    SpO2: 100% 100%    Physical Exam Vitals and nursing note reviewed.  Constitutional:      General: He is sleeping. He is not in acute distress.    Appearance: He is ill-appearing and diaphoretic.  HENT:     Head: Normocephalic and atraumatic.  Eyes:     Conjunctiva/sclera: Conjunctivae normal.     Pupils: Pupils are equal, round, and reactive to light.   Cardiovascular:     Rate and Rhythm: Normal rate and regular rhythm.     Pulses: Normal pulses.     Heart sounds: Normal heart sounds. No murmur heard.    No friction rub. No gallop.  Pulmonary:     Effort: Pulmonary effort is normal. No respiratory distress.     Breath sounds: Normal breath sounds. No stridor. No wheezing, rhonchi or rales.  Abdominal:     General: Abdomen is flat. There is no distension.     Palpations: Abdomen is soft. There is no mass.     Tenderness: There is no abdominal tenderness. There is no guarding or rebound.  Skin:    General: Skin is warm.     Capillary Refill: Capillary refill takes less than 2 seconds.  Neurological:     General: No focal deficit present.     Mental Status: He is easily aroused.     GCS: GCS eye subscore is 4. GCS verbal subscore is 5. GCS motor subscore is 6.     Procedures  MDM:  Imaging/radiology results:  CT Angio Chest PE W and/or Wo Contrast  Result Date: 05/07/2023 CLINICAL DATA:  High probability for PE.  Left-sided chest pain. EXAM: CT ANGIOGRAPHY CHEST WITH CONTRAST TECHNIQUE: Multidetector CT imaging of the chest was performed using the standard protocol during bolus administration  of intravenous contrast. Multiplanar CT image reconstructions and MIPs were obtained to evaluate the vascular anatomy. RADIATION DOSE REDUCTION: This exam was performed according to the departmental dose-optimization program which includes automated exposure control, adjustment of the mA and/or kV according to patient size and/or use of iterative reconstruction technique. CONTRAST:  75mL OMNIPAQUE IOHEXOL 350 MG/ML SOLN COMPARISON:  None Available. FINDINGS: Cardiovascular: Satisfactory opacification of the pulmonary arteries to the segmental level. No evidence of pulmonary embolism. Normal heart size. No pericardial effusion. Mediastinum/Nodes: No enlarged mediastinal, hilar, or axillary lymph nodes. Thyroid gland, trachea, and esophagus  demonstrate no significant findings. Lungs/Pleura: Lungs are clear. No pleural effusion or pneumothorax. Upper Abdomen: No acute abnormality. Musculoskeletal: No chest wall abnormality. No acute or significant osseous findings. Review of the MIP images confirms the above findings. IMPRESSION: No evidence of pulmonary embolism or acute intrathoracic process. Electronically Signed   By: Darliss Cheney M.D.   On: 05/07/2023 17:15   DG Chest Port 1 View  Result Date: 05/07/2023 CLINICAL DATA:  Chest pain and shortness of breath. EXAM: PORTABLE CHEST 1 VIEW COMPARISON:  Chest radiograph 02/27/2022 FINDINGS: The heart size and mediastinal contours are within normal limits. Both lungs are clear. The visualized skeletal structures are unremarkable. IMPRESSION: No active disease. Electronically Signed   By: Annia Belt M.D.   On: 05/07/2023 15:42    EKG results: ECG on my interpretation is normal sinus rhythm and rate, without anatomical ischemia representing STEMI, New-onset Arrhythmia, or ischemic equivalent.  Mildly prolonged QTc interval to 491.    Lab results:  Results for orders placed or performed during the hospital encounter of 05/07/23 (from the past 24 hour(s))  CBG monitoring, ED     Status: Abnormal   Collection Time: 05/07/23  2:29 PM  Result Value Ref Range   Glucose-Capillary 184 (H) 70 - 99 mg/dL  Basic metabolic panel     Status: Abnormal   Collection Time: 05/07/23  2:35 PM  Result Value Ref Range   Sodium 136 135 - 145 mmol/L   Potassium 3.3 (L) 3.5 - 5.1 mmol/L   Chloride 104 98 - 111 mmol/L   CO2 21 (L) 22 - 32 mmol/L   Glucose, Bld 167 (H) 70 - 99 mg/dL   BUN 5 (L) 6 - 20 mg/dL   Creatinine, Ser 4.09 0.61 - 1.24 mg/dL   Calcium 8.5 (L) 8.9 - 10.3 mg/dL   GFR, Estimated >81 >19 mL/min   Anion gap 11 5 - 15  CBC     Status: Abnormal   Collection Time: 05/07/23  2:35 PM  Result Value Ref Range   WBC 11.6 (H) 4.0 - 10.5 K/uL   RBC 4.56 4.22 - 5.81 MIL/uL   Hemoglobin 13.2  13.0 - 17.0 g/dL   HCT 14.7 82.9 - 56.2 %   MCV 87.1 80.0 - 100.0 fL   MCH 28.9 26.0 - 34.0 pg   MCHC 33.2 30.0 - 36.0 g/dL   RDW 13.0 86.5 - 78.4 %   Platelets 417 (H) 150 - 400 K/uL   nRBC 0.0 0.0 - 0.2 %  Troponin I (High Sensitivity)     Status: None   Collection Time: 05/07/23  2:35 PM  Result Value Ref Range   Troponin I (High Sensitivity) 3 <18 ng/L  Hepatic function panel     Status: Abnormal   Collection Time: 05/07/23  2:35 PM  Result Value Ref Range   Total Protein 6.4 (L) 6.5 - 8.1 g/dL   Albumin  3.6 3.5 - 5.0 g/dL   AST 28 15 - 41 U/L   ALT 21 0 - 44 U/L   Alkaline Phosphatase 48 38 - 126 U/L   Total Bilirubin 0.7 0.3 - 1.2 mg/dL   Bilirubin, Direct 0.2 0.0 - 0.2 mg/dL   Indirect Bilirubin 0.5 0.3 - 0.9 mg/dL  Lipase, blood     Status: None   Collection Time: 05/07/23  2:35 PM  Result Value Ref Range   Lipase 35 11 - 51 U/L  Acetaminophen level     Status: Abnormal   Collection Time: 05/07/23  3:23 PM  Result Value Ref Range   Acetaminophen (Tylenol), Serum <10 (L) 10 - 30 ug/mL  Salicylate level     Status: Abnormal   Collection Time: 05/07/23  3:23 PM  Result Value Ref Range   Salicylate Lvl <7.0 (L) 7.0 - 30.0 mg/dL  Ethanol     Status: None   Collection Time: 05/07/23  3:23 PM  Result Value Ref Range   Alcohol, Ethyl (B) <10 <10 mg/dL  Troponin I (High Sensitivity)     Status: None   Collection Time: 05/07/23  4:35 PM  Result Value Ref Range   Troponin I (High Sensitivity) 4 <18 ng/L     Key medications administered in the ER:  Medications  nitroGLYCERIN (NITROSTAT) SL tablet 0.4 mg (0.4 mg Sublingual Given 05/07/23 1440)  ondansetron (ZOFRAN) injection 4 mg (has no administration in time range)  ondansetron (ZOFRAN) injection 4 mg (4 mg Intravenous Given 05/07/23 1448)  pantoprazole (PROTONIX) 80 mg /NS 100 mL IVPB (0 mg Intravenous Stopped 05/07/23 1525)  lactated ringers bolus 1,000 mL (0 mLs Intravenous Stopped 05/07/23 1700)  potassium chloride  10 mEq in 100 mL IVPB (0 mEq Intravenous Stopped 05/07/23 1700)  magnesium sulfate IVPB 2 g 50 mL (0 g Intravenous Stopped 05/07/23 1700)  iohexol (OMNIPAQUE) 350 MG/ML injection 75 mL (75 mLs Intravenous Contrast Given 05/07/23 1644)   Medical decision making: -Vital signs stable on supplimental O2. Patient afebrile, hemodynamically stable, and non-toxic appearing. -Patient's presentation is most consistent with acute presentation with potential threat to life or bodily function.. -Victor Stanley is a 22 y.o. male presenting to the emergency department with chest pain as above.  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 -Patient is presenting with chest pain that is since resolved.  Will proceed with cardiac workup as well as CT PE study.  Will also obtain toxic/metabolic workup due to concerns the patient potentially ingested something.  EKG I obtained reveals no anatomical ischemia representing STEMI, New-onset Arrhythmia, or ischemic equivalent.  Initial troponin normal at 3 with repeat troponin normal at 4.  No troponin of 1.  Therefore do not suspect ACS at this time. No concerns for Pericardial Tamponade on EKG and in light of patients hemodynamic stability doubt this pathology. No pain related to supine or prone positions and given EKG doubt Pericarditis. CXR unremarkable for focal airspace disease, patient is afebrile, no cough, do not suspect Pneumonia. CXR without evidence of Pneumothorax. CXR/CT without concern for Esophageal Tear. No peritonitis or free air on CXR worrisome for Perforated Abdominal Viscous. Unlikely myocarditis, does not fit clinical picture, chest pain not exertional, no EKG findings to support.  CT PE study is negative for PE. Pain is not described as tearing and does not radiate to back, no pulse deficit, no neurologic complaints. CXR does not show widened mediastinum.  No findings on  CTA chest.  Doubt Aortic  Dissection. -Patient was appropriately risk stratified with HEART score of 0.  Patient able to be weaned from oxygen to room air and has been on room air for several hours.  Salicylate level undetectable.  Ethanol level undetectable.  Tylenol level undetectable.  Lipase is normal.  Hepatic function all unremarkable.  BMP shows mild hypokalemia but is otherwise unremarkable.  Patient given potassium repletion and due to slightly prolonged QTc given magnesium repletion.  Upon reassessment, patient having vomiting therefore given Zofran and additional IV fluid bolus.  Upon reassessment, patient with significant improvement in symptoms.  He is awake and alert and tolerating p.o. intake.  Patient is stable for discharge at this time.  I discussed strict return precautions for ED return.  Discussed follow-up with PCP.  Patient discharged.    Medical Decision Making Amount and/or Complexity of Data Reviewed Labs: ordered. Decision-making details documented in ED Course. Radiology: ordered and independent interpretation performed. Decision-making details documented in ED Course. ECG/medicine tests: ordered and independent interpretation performed. Decision-making details documented in ED Course.  Risk Prescription drug management.     The plan for this patient was discussed with Dr. Adela Lank, who voiced agreement and who oversaw evaluation and treatment of this patient.  Marta Lamas, MD Emergency Medicine, PGY-3  Note: Dragon medical dictation software was used in the creation of this note.   Clinical Impression:  1. Chest pain, unspecified type          Chase Caller, MD 05/07/23 2120    Melene Plan, DO 05/07/23 2240

## 2023-05-07 NOTE — Discharge Instructions (Signed)
Victor Stanley:  Thank you for allowing [place] to take care of you today.  We hope you begin feeling better soon.  To-Do: Please follow-up with your primary doctor. Please return to the Emergency Department or call 911 if you experience chest pain, shortness of breath, severe pain, severe fever, altered mental status, or have any reason to think that you need emergency medical care.  Thank you again.  Hope you feel better soon.  Department of Emergency Medicine [facility]

## 2023-05-07 NOTE — ED Notes (Signed)
Discharge instructions provided by edp were discussed with pt. Pt verbalized understanding with no additional questions at this time. Pt to depart with officer at bedside

## 2023-05-07 NOTE — ED Notes (Signed)
Upon receiving this pt. Pt found to be pale and diaphoretic with nausea. One episode of vomiting

## 2023-07-22 IMAGING — CT CT ABD-PELV W/ CM
2 of 4 series · 11 of 46 positions shown, 12 images · IV contrast (APPLIED)
Comparison: None.

CLINICAL DATA: Left upper quadrant/epigastric abdominal pain nausea
and vomiting over the last 4 days.

EXAM:
CT ABDOMEN AND PELVIS WITH CONTRAST
TECHNIQUE: Multidetector CT imaging of the abdomen and pelvis was performed
using the standard protocol following bolus administration of
intravenous contrast.

[Series 3: abdomen 5.0 · axial · 0.50mm/px · z∈[+791,+1191]mm · 8 of 102 slices shown, 9 images]
[im 11/102  soft-tissue]
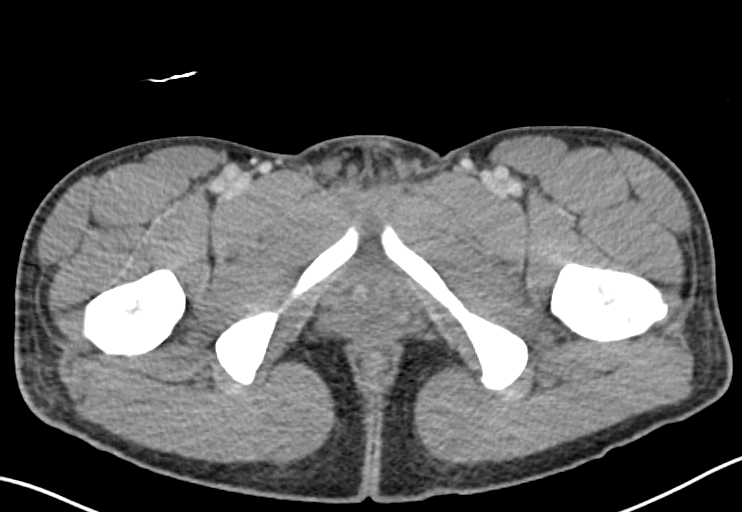
[im 11/102  bone]
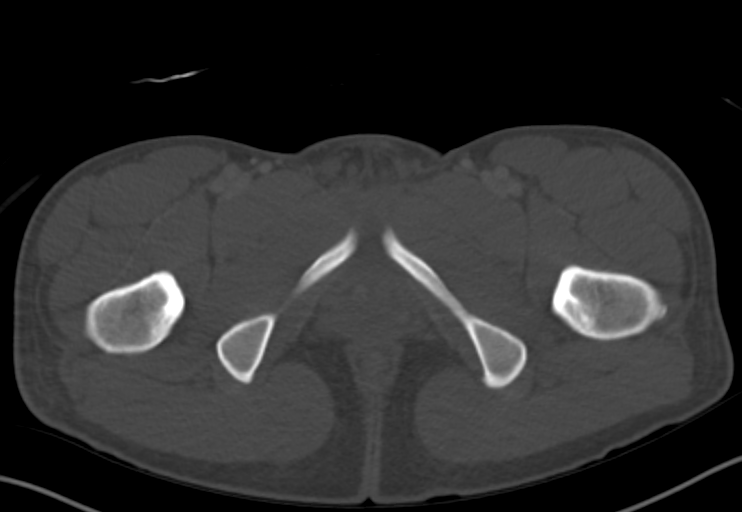
[im 21/102  soft-tissue]
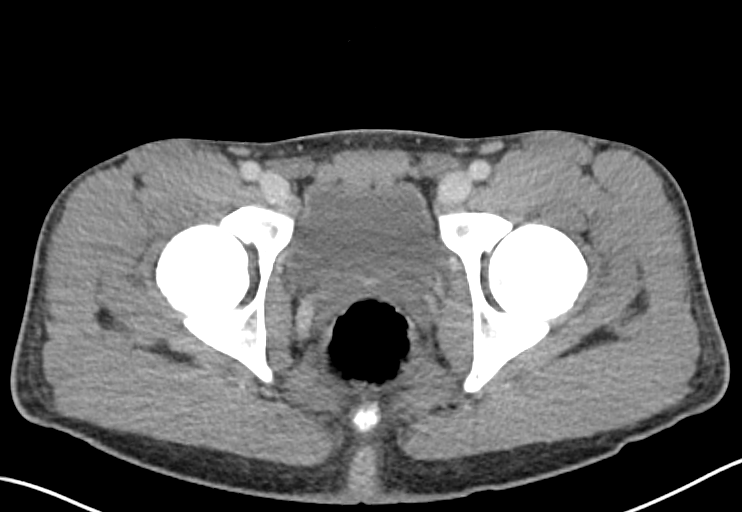
[im 31/102  soft-tissue]
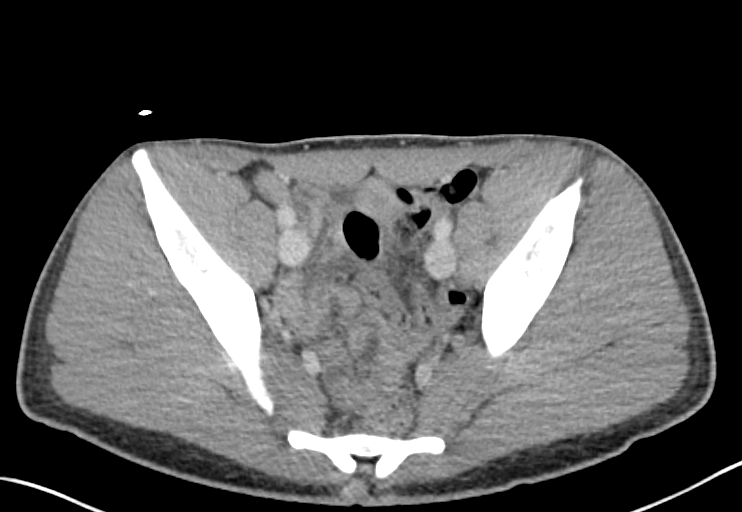
[im 46/102  soft-tissue]
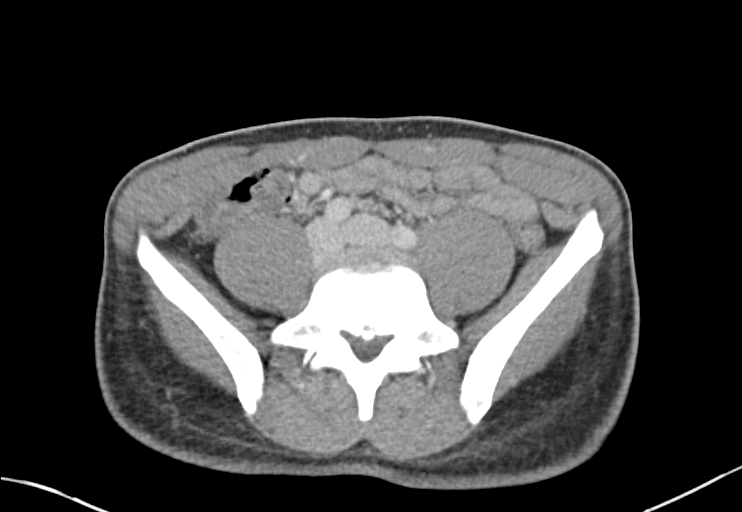
[im 56/102  soft-tissue]
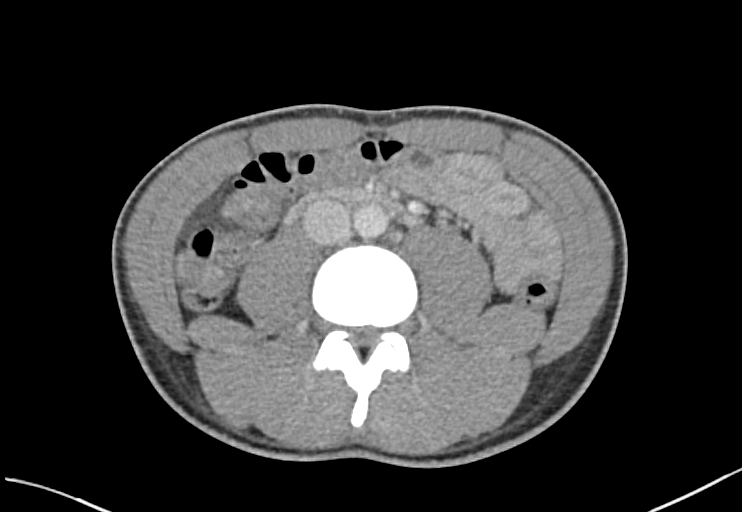
[im 71/102  soft-tissue]
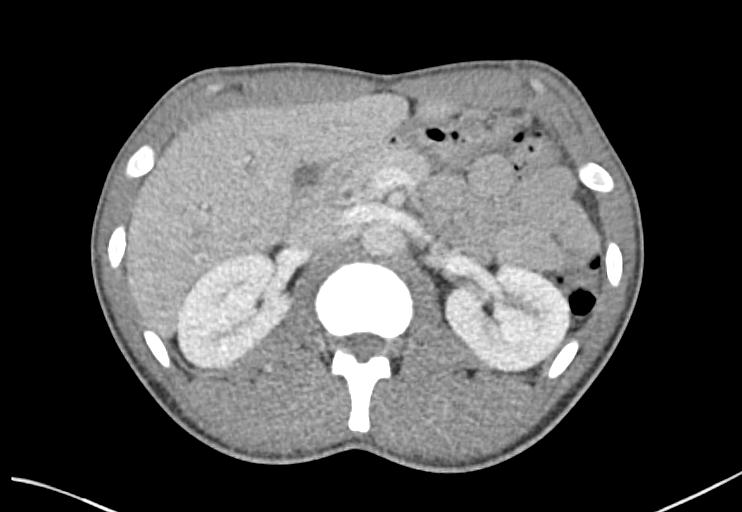
[im 81/102  soft-tissue]
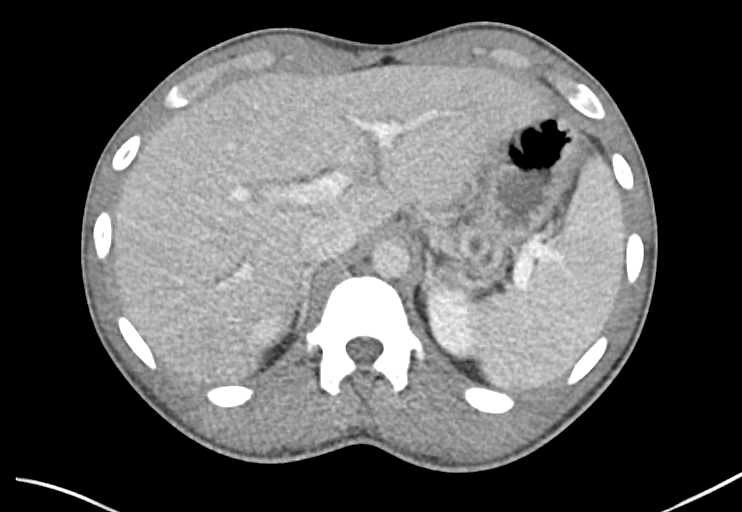
[im 91/102  soft-tissue]
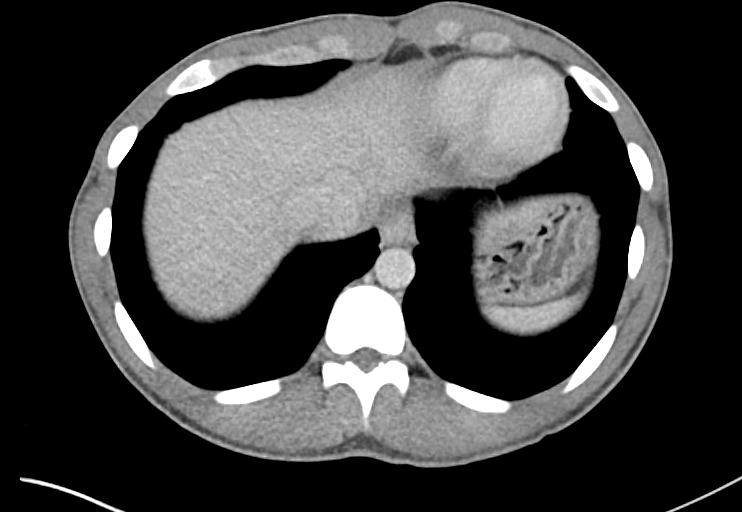

[Series 6: abdomen 3.0 mpr cor · coronal · 0.72mm/px · 3 of 86 slices shown]
[im 29/86  soft-tissue]
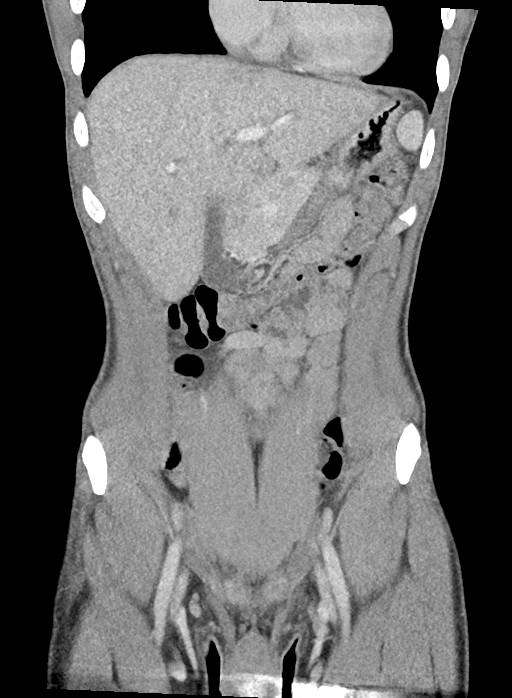
[im 38/86  soft-tissue]
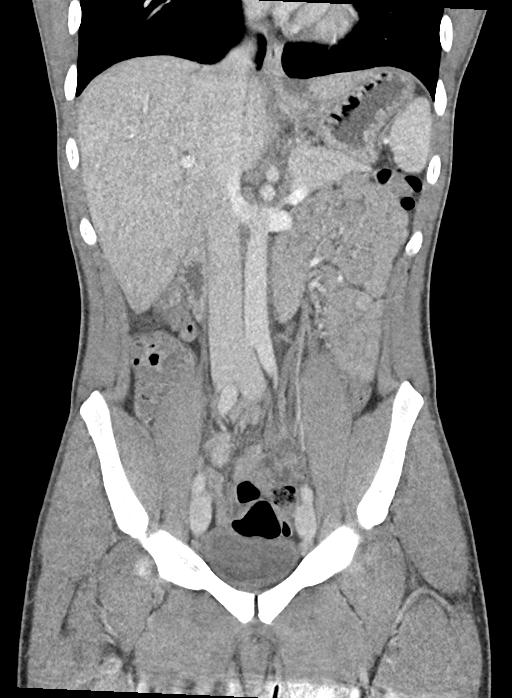
[im 48/86  soft-tissue]
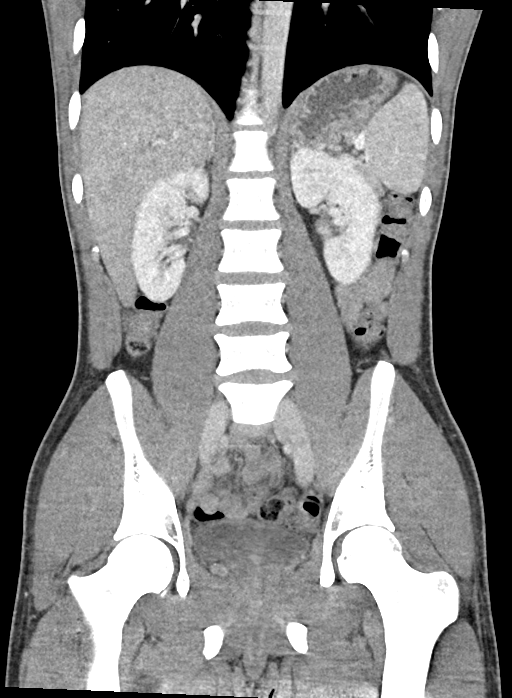

[11 of 46 positions shown; findings below may reference images not displayed]

RADIATION DOSE REDUCTION: This exam was performed according to the
departmental dose-optimization program which includes automated
exposure control, adjustment of the mA and/or kV according to
patient size and/or use of iterative reconstruction technique.

CONTRAST:  100mL OMNIPAQUE IOHEXOL 350 MG/ML SOLN
FINDINGS: Lower chest: Unremarkable

Hepatobiliary: Trace periportal edema. Gallbladder unremarkable. No
focal hepatic lesion.

Pancreas: Unremarkable

Spleen: Unremarkable

Adrenals/Urinary Tract: Unremarkable

Stomach/Bowel: Unremarkable

Vascular/Lymphatic: Unremarkable

Reproductive: Unremarkable

Other: No supplemental non-categorized findings.

Musculoskeletal: Congenitally short pedicles in the lower lumbar
spine, along with degenerative disc disease this may be contributing
to mild impingement at the L3-4 and L4-5 levels.
IMPRESSION: 1. Trace periportal edema. This is a nonspecific finding which can
be from a variety of causes including aggressive fluid
administration, cardiac congestion, viral hepatitis, trauma,
cholangitis.
2. Congenitally short pedicles in the lower lumbar spine. Along with
degenerative disc disease this may be contributing to mild
impingement at L3-4 and L4-5.
